# Patient Record
Sex: Female | Born: 1950
Health system: Southern US, Community
[De-identification: ages and names within clinical notes are randomized; demographics above are authoritative.]

## PROBLEM LIST (undated history)

## (undated) DIAGNOSIS — E785 Hyperlipidemia, unspecified: Secondary | ICD-10-CM

## (undated) DIAGNOSIS — F32A Depression, unspecified: Secondary | ICD-10-CM

## (undated) DIAGNOSIS — M1990 Unspecified osteoarthritis, unspecified site: Secondary | ICD-10-CM

## (undated) DIAGNOSIS — F419 Anxiety disorder, unspecified: Secondary | ICD-10-CM

## (undated) DIAGNOSIS — F329 Major depressive disorder, single episode, unspecified: Secondary | ICD-10-CM

## (undated) DIAGNOSIS — Z86010 Personal history of colonic polyps: Secondary | ICD-10-CM

## (undated) DIAGNOSIS — K52832 Lymphocytic colitis: Principal | ICD-10-CM

## (undated) DIAGNOSIS — C50919 Malignant neoplasm of unspecified site of unspecified female breast: Secondary | ICD-10-CM

## (undated) HISTORY — PX: COLONOSCOPY: SHX174

## (undated) HISTORY — DX: Anxiety disorder, unspecified: F41.9

## (undated) HISTORY — DX: Hyperlipidemia, unspecified: E78.5

## (undated) HISTORY — PX: TONSILLECTOMY: SUR1361

## (undated) HISTORY — DX: Major depressive disorder, single episode, unspecified: F32.9

## (undated) HISTORY — DX: Lymphocytic colitis: K52.832

## (undated) HISTORY — DX: Personal history of colonic polyps: Z86.010

## (undated) HISTORY — DX: Depression, unspecified: F32.A

## (undated) HISTORY — PX: POLYPECTOMY: SHX149

## (undated) HISTORY — DX: Malignant neoplasm of unspecified site of unspecified female breast: C50.919

## (undated) HISTORY — DX: Unspecified osteoarthritis, unspecified site: M19.90

---

## 2000-06-20 DIAGNOSIS — C50919 Malignant neoplasm of unspecified site of unspecified female breast: Secondary | ICD-10-CM

## 2000-06-20 HISTORY — DX: Malignant neoplasm of unspecified site of unspecified female breast: C50.919

## 2000-06-20 HISTORY — PX: MASTECTOMY: SHX3

## 2000-06-20 HISTORY — PX: RECONSTRUCTION BREAST W/ TRAM FLAP: SUR1079

## 2003-06-21 DIAGNOSIS — Z86010 Personal history of colonic polyps: Secondary | ICD-10-CM

## 2003-06-21 DIAGNOSIS — Z860101 Personal history of adenomatous and serrated colon polyps: Secondary | ICD-10-CM

## 2003-06-21 HISTORY — DX: Personal history of colonic polyps: Z86.010

## 2003-06-21 HISTORY — DX: Personal history of adenomatous and serrated colon polyps: Z86.0101

## 2003-07-18 DIAGNOSIS — Z860100 Personal history of colon polyps, unspecified: Secondary | ICD-10-CM | POA: Insufficient documentation

## 2010-07-07 ENCOUNTER — Ambulatory Visit: Admission: RE | Admit: 2010-07-07 | Payer: Self-pay | Source: Home / Self Care | Admitting: Vascular Surgery

## 2010-07-30 ENCOUNTER — Inpatient Hospital Stay (INDEPENDENT_AMBULATORY_CARE_PROVIDER_SITE_OTHER)
Admission: RE | Admit: 2010-07-30 | Discharge: 2010-07-30 | Disposition: A | Discharge status: Home / Self Care | Payer: Commercial Managed Care - PPO | Source: Ambulatory Visit | Attending: Emergency Medicine | Admitting: Emergency Medicine

## 2010-07-30 DIAGNOSIS — L989 Disorder of the skin and subcutaneous tissue, unspecified: Secondary | ICD-10-CM

## 2010-07-30 LAB — COMPREHENSIVE METABOLIC PANEL
ALT: 27 U/L (ref 0–35)
AST: 32 U/L (ref 0–37)
Albumin: 4.5 g/dL (ref 3.5–5.2)
Calcium: 9.8 mg/dL (ref 8.4–10.5)
Creatinine, Ser: 0.51 mg/dL (ref 0.4–1.2)
GFR calc Af Amer: >60 mL/min (ref >60)
Sodium: 138 mEq/L (ref 135–145)
Total Protein: 6.9 g/dL (ref 6.0–8.3)

## 2010-07-30 LAB — DIFFERENTIAL
Basophils Absolute: 0 10*3/uL (ref 0.0–0.1)
Basophils Relative: 0 % (ref 0–1)
Eosinophils Absolute: 0.2 10*3/uL (ref 0.0–0.7)
Eosinophils Relative: 2 % (ref 0–5)
Monocytes Absolute: 0.6 10*3/uL (ref 0.1–1.0)
Neutro Abs: 5.1 10*3/uL (ref 1.7–7.7)

## 2010-07-30 LAB — CBC
Hemoglobin: 13 g/dL (ref 12.0–15.0)
MCHC: 34.4 g/dL (ref 30.0–36.0)
Platelets: 258 10*3/uL (ref 150–400)
RDW: 13.5 % (ref 11.5–15.5)

## 2010-08-02 LAB — ANA: Anti Nuclear Antibody(ANA): NEGATIVE

## 2010-11-02 ENCOUNTER — Encounter: Payer: Self-pay | Admitting: Oncology

## 2010-11-25 ENCOUNTER — Encounter (HOSPITAL_BASED_OUTPATIENT_CLINIC_OR_DEPARTMENT_OTHER): Payer: 59 | Admitting: Oncology

## 2010-11-25 ENCOUNTER — Other Ambulatory Visit (HOSPITAL_COMMUNITY): Payer: Self-pay | Admitting: Oncology

## 2010-11-25 ENCOUNTER — Other Ambulatory Visit: Payer: Self-pay | Admitting: Physician Assistant

## 2010-11-25 DIAGNOSIS — C50919 Malignant neoplasm of unspecified site of unspecified female breast: Secondary | ICD-10-CM

## 2010-11-25 LAB — COMPREHENSIVE METABOLIC PANEL WITH GFR
ALT: 23 U/L (ref 0–35)
AST: 25 U/L (ref 0–37)
Albumin: 4.1 g/dL (ref 3.5–5.2)
Alkaline Phosphatase: 87 U/L (ref 39–117)
BUN: 19 mg/dL (ref 6–23)
CO2: 27 meq/L (ref 19–32)
Calcium: 9.8 mg/dL (ref 8.4–10.5)
Chloride: 98 meq/L (ref 96–112)
Creatinine, Ser: 0.59 mg/dL (ref 0.50–1.10)
Glucose, Bld: 99 mg/dL (ref 70–99)
Potassium: 3.8 meq/L (ref 3.5–5.3)
Sodium: 135 meq/L (ref 135–145)
Total Bilirubin: 0.4 mg/dL (ref 0.3–1.2)
Total Protein: 6.4 g/dL (ref 6.0–8.3)

## 2010-11-25 LAB — CBC WITH DIFFERENTIAL/PLATELET
BASO%: 0.4 % (ref 0.0–2.0)
Basophils Absolute: 0 10e3/uL (ref 0.0–0.1)
EOS%: 1.6 % (ref 0.0–7.0)
Eosinophils Absolute: 0.2 10e3/uL (ref 0.0–0.5)
HCT: 36.8 % (ref 34.8–46.6)
HGB: 12.7 g/dL (ref 11.6–15.9)
LYMPH%: 31.6 % (ref 14.0–49.7)
MCH: 31.5 pg (ref 25.1–34.0)
MCHC: 34.5 g/dL (ref 31.5–36.0)
MCV: 91.3 fL (ref 79.5–101.0)
MONO#: 0.8 10e3/uL (ref 0.1–0.9)
MONO%: 7.9 % (ref 0.0–14.0)
NEUT#: 5.6 10e3/uL (ref 1.5–6.5)
NEUT%: 58.5 % (ref 38.4–76.8)
Platelets: 232 10e3/uL (ref 145–400)
RBC: 4.03 10e6/uL (ref 3.70–5.45)
RDW: 13.4 % (ref 11.2–14.5)
WBC: 9.5 10e3/uL (ref 3.9–10.3)
lymph#: 3 10e3/uL (ref 0.9–3.3)

## 2010-11-25 LAB — LACTATE DEHYDROGENASE: LDH: 190 U/L (ref 94–250)

## 2010-11-26 LAB — VITAMIN D 25 HYDROXY (VIT D DEFICIENCY, FRACTURES): Vit D, 25-Hydroxy: 48 ng/mL (ref 30–89)

## 2011-05-25 ENCOUNTER — Encounter: Payer: Self-pay | Admitting: Internal Medicine

## 2011-06-01 ENCOUNTER — Telehealth: Payer: Self-pay | Admitting: Oncology

## 2011-06-01 NOTE — Telephone Encounter (Signed)
Lvm advising appt 07/25/11 @ 2.30pm r/s'd from 12/6 appt cx'd due to Epic.

## 2011-07-04 ENCOUNTER — Ambulatory Visit (AMBULATORY_SURGERY_CENTER): Payer: 59 | Admitting: *Deleted

## 2011-07-04 VITALS — Ht 64.0 in | Wt 141.3 lb

## 2011-07-04 DIAGNOSIS — Z1211 Encounter for screening for malignant neoplasm of colon: Secondary | ICD-10-CM

## 2011-07-04 MED ORDER — PEG-KCL-NACL-NASULF-NA ASC-C 100 G PO SOLR: ORAL | Status: DC

## 2011-07-04 NOTE — Progress Notes (Signed)
Pt takes Seroquel, Celexa, and Xanax.  Rescheduled colonoscopy with Propofol.  Pt had colonoscopy for colon polyps 3 years ago in [place], [place] with Dr. Payton Mccallum.  Release of information form signed and will give to Aurelio Brash, CMA.  Colonoscopy scheduled for Monday 2/4 at 2:00 pm Ezra Sites

## 2011-07-06 ENCOUNTER — Telehealth: Payer: Self-pay | Admitting: *Deleted

## 2011-07-06 NOTE — Telephone Encounter (Signed)
Pt had colonoscopy for colon polyps 3 years ago in [place], [place] with Dr. Payton Mccallum. Release of information form signed and will give to Aurelio Brash, CMA. Colonoscopy scheduled for Monday 2/4 at 2:00 pm Ezra Sites

## 2011-07-06 NOTE — Telephone Encounter (Signed)
Medical records release faxed 

## 2011-07-18 ENCOUNTER — Encounter: Payer: Self-pay | Admitting: Internal Medicine

## 2011-07-18 ENCOUNTER — Telehealth: Payer: Self-pay

## 2011-07-18 ENCOUNTER — Other Ambulatory Visit: Payer: 59 | Admitting: Internal Medicine

## 2011-07-18 NOTE — Telephone Encounter (Signed)
Left message for patient to call back  

## 2011-07-18 NOTE — Telephone Encounter (Signed)
Message copied by Annett Fabian on Mon Jul 18, 2011  2:57 PM ------      Message from: Stan Head E      Created: Mon Jul 18, 2011  2:04 PM      Regarding: results of records review       Please let her know I reviewed outside records      Adenomas 2005 and none in 2010      Since no polyps in 2010 then next routine colonoscopy would be 2015 based upon my understanding of current guidelines             Leas let her know this - not "wrong" to do it now but it is early and I think appropriate to wait until 06/2013 unless there are other issues                  Her choice - let me know if ?'s

## 2011-07-18 NOTE — Telephone Encounter (Signed)
Records received

## 2011-07-19 NOTE — Telephone Encounter (Signed)
Patient advised.  New recall placed for 06/2013

## 2011-07-22 ENCOUNTER — Telehealth: Payer: Self-pay | Admitting: Oncology

## 2011-07-22 NOTE — Telephone Encounter (Signed)
spoke with pt and she is aware of 2/11 appt per dr ll  aom °

## 2011-07-25 ENCOUNTER — Other Ambulatory Visit: Payer: 59 | Admitting: Lab

## 2011-07-25 ENCOUNTER — Encounter: Payer: 59 | Admitting: Internal Medicine

## 2011-07-25 ENCOUNTER — Ambulatory Visit: Payer: 59 | Admitting: Oncology

## 2011-08-15 ENCOUNTER — Telehealth: Payer: Self-pay | Admitting: Oncology

## 2011-08-15 NOTE — Telephone Encounter (Signed)
pt called to verify appt info 3/25    aom

## 2011-08-17 ENCOUNTER — Telehealth: Payer: Self-pay | Admitting: Oncology

## 2011-08-17 NOTE — Telephone Encounter (Signed)
lm that 3/25 appt had been cx and r/s to 4/15   aom

## 2011-09-01 ENCOUNTER — Encounter: Payer: 59 | Admitting: Internal Medicine

## 2011-09-12 ENCOUNTER — Ambulatory Visit: Payer: 59 | Admitting: Oncology

## 2011-09-12 ENCOUNTER — Other Ambulatory Visit: Payer: 59 | Admitting: Lab

## 2011-09-15 ENCOUNTER — Telehealth: Payer: Self-pay | Admitting: Oncology

## 2011-09-15 NOTE — Telephone Encounter (Signed)
per kb move appt to 4/18 with sara,l/m with appt info for pt aom

## 2011-09-22 ENCOUNTER — Telehealth: Payer: Self-pay | Admitting: Oncology

## 2011-09-22 NOTE — Telephone Encounter (Signed)
pt l/m to r/s appt new appt is 4/29 per pt aom

## 2011-09-30 ENCOUNTER — Telehealth: Payer: Self-pay | Admitting: Oncology

## 2011-09-30 NOTE — Telephone Encounter (Signed)
Time of 4/29 appt change to 9 am due to schedule change for SW. lmonvm for pt re new time. Schedule mailed.

## 2011-10-03 ENCOUNTER — Other Ambulatory Visit: Payer: 59 | Admitting: Lab

## 2011-10-03 ENCOUNTER — Ambulatory Visit: Payer: 59 | Admitting: Oncology

## 2011-10-03 ENCOUNTER — Telehealth: Payer: Self-pay | Admitting: Oncology

## 2011-10-03 NOTE — Telephone Encounter (Signed)
Pt called to r/s her 4/29 appt to 5/8    aom

## 2011-10-06 ENCOUNTER — Telehealth: Payer: Self-pay | Admitting: Oncology

## 2011-10-06 ENCOUNTER — Other Ambulatory Visit: Payer: 59 | Admitting: Lab

## 2011-10-06 ENCOUNTER — Ambulatory Visit: Payer: 59 | Admitting: Physician Assistant

## 2011-10-06 NOTE — Telephone Encounter (Signed)
PT CALLED TO R/S 5/8 TO 5/10  AOM

## 2011-10-17 ENCOUNTER — Ambulatory Visit: Payer: 59 | Admitting: Physician Assistant

## 2011-10-17 ENCOUNTER — Other Ambulatory Visit: Payer: 59 | Admitting: Lab

## 2011-10-26 ENCOUNTER — Other Ambulatory Visit: Payer: 59 | Admitting: Lab

## 2011-10-26 ENCOUNTER — Ambulatory Visit: Payer: 59 | Admitting: Physician Assistant

## 2011-10-28 ENCOUNTER — Other Ambulatory Visit (HOSPITAL_BASED_OUTPATIENT_CLINIC_OR_DEPARTMENT_OTHER): Payer: 59 | Admitting: Lab

## 2011-10-28 ENCOUNTER — Other Ambulatory Visit: Payer: Self-pay | Admitting: Oncology

## 2011-10-28 ENCOUNTER — Ambulatory Visit (HOSPITAL_BASED_OUTPATIENT_CLINIC_OR_DEPARTMENT_OTHER): Payer: 59 | Admitting: Physician Assistant

## 2011-10-28 ENCOUNTER — Encounter: Payer: Self-pay | Admitting: Physician Assistant

## 2011-10-28 ENCOUNTER — Telehealth: Payer: Self-pay | Admitting: Oncology

## 2011-10-28 VITALS — BP 121/63 | HR 57 | Temp 96.9°F | Ht 64.0 in | Wt 144.5 lb

## 2011-10-28 DIAGNOSIS — C50919 Malignant neoplasm of unspecified site of unspecified female breast: Secondary | ICD-10-CM

## 2011-10-28 DIAGNOSIS — Z853 Personal history of malignant neoplasm of breast: Secondary | ICD-10-CM

## 2011-10-28 LAB — COMPREHENSIVE METABOLIC PANEL
ALT: 16 U/L (ref 0–35)
AST: 23 U/L (ref 0–37)
Albumin: 4.5 g/dL (ref 3.5–5.2)
Alkaline Phosphatase: 61 U/L (ref 39–117)
Glucose, Bld: 99 mg/dL (ref 70–99)
Potassium: 4.4 mEq/L (ref 3.5–5.3)
Sodium: 138 mEq/L (ref 135–145)
Total Bilirubin: 0.4 mg/dL (ref 0.3–1.2)
Total Protein: 6.4 g/dL (ref 6.0–8.3)

## 2011-10-28 LAB — CBC WITH DIFFERENTIAL/PLATELET
BASO%: 1.2 % (ref 0.0–2.0)
Basophils Absolute: 0.1 10*3/uL (ref 0.0–0.1)
EOS%: 6 % (ref 0.0–7.0)
HGB: 12.6 g/dL (ref 11.6–15.9)
MCH: 31.5 pg (ref 25.1–34.0)
MCHC: 33.8 g/dL (ref 31.5–36.0)
MCV: 93.2 fL (ref 79.5–101.0)
MONO%: 9.8 % (ref 0.0–14.0)
RBC: 4.01 10*6/uL (ref 3.70–5.45)
RDW: 12.9 % (ref 11.2–14.5)
lymph#: 2.2 10*3/uL (ref 0.9–3.3)
nRBC: 0 % (ref 0–0)

## 2011-10-28 NOTE — Telephone Encounter (Signed)
appts made and printed for pt aom °

## 2011-10-28 NOTE — Progress Notes (Signed)
[facility] OFFICE PROGRESS NOTE  Donna Kayser, MD, MD  PATIENT IDENTIFICATION: 61 year old woman originally from [place], [place], with a diagnosis of breast cancer,  0.8 cm ductal carcinoma with extensive DCIS apparently detected in December 2001.  She underwent an axillary lymph node dissection in January 2002 and then underwent a left modified radical mastectomy with TRAM reconstruction in February 2002.  There was micrometastatic involvement of 1/10 lymph nodes. Therefore, this was at T1 N1 cancer, apparently and initially  stage IIA .In looking at the cancer staging handbook from AJCC, patient's with T2 and T1 tumors with nodal micrometastases only are excluded from stage IIA and are classified a stage IB.  Therefore, this patient, who had a fairly small primary tumor, T1b, and apparently micrometastatic focus in one node, may technically be classified as a T1b, according to the seventh edition of the cancer staging handbook. She is Estrogen and progesterone receptors were positive.  HER-2/neu was negative.  The patient underwent chemotherapy with Adriamycin and Cytoxan for four cycles and then Taxotere for four cycles.  She received tamoxifen for 2 years and then when she became postmenopausal, she was placed on Arimidex. This was discontinued as of 10 years later, during her first visit to [place] in 11/25/2010. The patient's last mammogram, according to the records we received, was June 01/2012 at [facility]. Records are not available at this time. She is due for another mammogram in June of this year. She had a bone density scan on July 25, 2008.  T-score of the lumbar spine was between 0 and -1, normal.  The lowest T-score was -1.3 and that would involve the greater trochanter.     INTERVAL HISTORY: Donna Hamilton returns today for a followup appointment, with no clinical complaints. She remains active, able to complete activities of daily been without complications. She works at the [employer] full time. It has been one year since she moved from [place], stating that she enjoys the [place] quite a bit. She denies any fever, chills ,night sweats,nausea, vomiting ,headaches or shortness of breath. Her last physical exam was performed in July of last year. In March of 2013, labs revealed elevated triglycerides to 1335, for which her primary care physician, Dr. Waynard Edwards, placed to her on Lovaza 1 g bid, with significant reduction of these triglycerides (patient does not recall the exact value but states  has responded well to the drug. No appetite changes.Her weight has remained stable. On her left breast, she denies any new masses, nipple discharge or inversion. She performs manual on a monthly basis. The right breast remains without any new masses according to her. Today, her CBC shows a white count of 6.1 with a neutrophile count of 2.8,  hemoglobin of 12.6 hematocrit 37.3 MCV 93.2 and platelet count of 264. On 11/25/2010, her white count was 9.5, ANC 5.6, hemoglobin 12.7, hematocrit 36.8, platelets 232,000.  Differential is normal as are red cell indices. Chemistries were normal, with new chemistries pending. The last LDH in 11/25/2010 was 190, with new LDH pending. Her last vitamin D on the same day was 48, with new value is pending. She is due for a new chest x-ray during her upcoming  physical according to her.   MEDICAL HISTORY: Past Medical History  Diagnosis Date  . Arthritis   . Anxiety   . Depression   . Breast cancer 2002    left breast  . Hx of adenomatous colonic polyps 2005  [place]    SURGICAL HISTORY:  Past Surgical History  Procedure Date  . Mastectomy 2002    left  . Tonsillectomy     MEDICATIONS: Current Outpatient Prescriptions  Medication Sig Dispense Refill  . ALPRAZolam (XANAX) 0.25 MG tablet Take 0.25 mg by mouth at bedtime as needed.      . Calcium Carbonate-Vitamin D (CALTRATE 600+D PO) Take by mouth. Takes 2 tablets daily      . Cholecalciferol  (VITAMIN D3) 2000 UNITS TABS Take by mouth daily.      . citalopram (CELEXA) 20 MG tablet Take 20 mg by mouth daily. Takes 3 tablets daily      . fish oil-omega-3 fatty acids 1000 MG capsule Take by mouth daily. Takes 2 capsules daily      . Glucosamine HCl-Glucosamin SO4 (GLUCOSAMINE COMPLEX PO) Take by mouth. Takes 2 tablets daily      . Multiple Vitamin (MULTIVITAMIN) tablet Take 1 tablet by mouth daily.      . niacinamide 500 MG tablet Take 500 mg by mouth. Takes 2 tablets daily      . omega-3 acid ethyl esters (LOVAZA) 1 G capsule Take 1 g by mouth 2 (two) times daily.      . peg 3350 powder (MOVIPREP) 100 G SOLR moviprep as directed  1 kit  0  . QUEtiapine (SEROQUEL) 25 MG tablet Take 25 mg by mouth at bedtime.        ALLERGIES:   has no known allergies.  REVIEW OF SYSTEMS:  The rest of the 14-point review of system was negative.   Filed Vitals:   10/28/11 0929  BP: 121/63  Pulse: 57  Temp: 96.9 F (36.1 C)   Wt Readings from Last 3 Encounters:  10/28/11 144 lb 8 oz (65.545 kg)  07/04/11 141 lb 4.8 oz (64.093 kg)     PHYSICAL EXAMINATION:. 61 year old white female in no acute distress, who looks at her stated age.  No scalp or skull lesions.  Pupillary and extraocular movements are normal.  No scleral icterus.  Mouth and pharynx benign.  Dentition is well maintained.  Neck is without adenopathy, thyroid enlargement or bruit.  Heart/Lungs:  Normal.  Back:  No skeletal tenderness or deformity.  The left breast has been reconstructed with a TRAM procedure.  She has also had reconstruction of the nipple-areolar complex.  Incisions are well-healed.  There is good symmetry between the breasts and the cosmetic result is favorable.  Both breasts are generally soft, unremarkable, with no suspicious findings, dimpling or nipple inversion.  No axillary or inguinal adenopathy.  Abdomen:  Benign with no organomegaly or masses palpable.  Surgical scars have healed well.  Extremities:  No  peripheral edema, clubbing, erythema, petechiae or purpura.  She does have a well-healed scar from a Port-A-Cath that was present, subsequently removed in the right chest.  Neurologic:  Grossly normal.    LABORATORY/RADIOLOGY DATA:   Lab 10/28/11 0905  WBC 6.1  HGB 12.6  HCT 37.3  PLT 264  MCV 93.2  MCH 31.5  MCHC 33.8  RDW 12.9  LYMPHSABS 2.2  MONOABS 0.6  EOSABS 0.4  BASOSABS 0.1  BANDABS --    CMP   No results found for this basename: NA:5,K:5,CL:5,CO2:5,GLUCOSE:5,BUN:5,CREATININE:5,GFRCGP,:5,CALCIUM:5,MG:5,AST:5,ALT:5,ALKPHOS:5,BILITOT:5 in the last 168 hours      Component Value Date/Time   BILITOT 0.4 11/25/2010 1448     Radiology Studies:  No results found.     ASSESSMENT AND PLAN:  61 year old  woman with  Stage  T1b, ductal carcinoma with extensive DCIS, s/p axillary lymph node dissection in January 2002 and left modified radical mastectomy with TRAM reconstruction in February 2002.  There was micrometastatic involvement of 1/10 lymph nodes. Estrogen and progesterone receptors were positive.  HER-2/neu was negative.  The patient underwent chemotherapy with Adriamycin and Cytoxan for four cycles and then Taxotere for four cycles.  She received tamoxifen for 2 years and then when she became postmenopausal, she was placed on Arimidex. This was discontinued on 11/26/2010. She has remained on observation only.  Kaelynn is clinically doing well, now close to 11 1/2 years from the time of her diagnosis in December of 2001. They are no signs or symptoms to suggest recurrence of the disease. Dr. Arline Asp has seen and evaluated the patient, concluding that she can be followup in one year basis. She agreed with the plan. She is to return in May of 2014, with labs. In the interim she is to undergo a new mammogram, which records are going to be sent to [place] for evaluation. She knows to call us if she has any questions or concerns.

## 2011-11-10 ENCOUNTER — Other Ambulatory Visit: Payer: Self-pay | Admitting: Obstetrics & Gynecology

## 2011-11-10 DIAGNOSIS — Z1231 Encounter for screening mammogram for malignant neoplasm of breast: Secondary | ICD-10-CM

## 2011-11-21 ENCOUNTER — Ambulatory Visit: Payer: 59

## 2011-12-01 ENCOUNTER — Ambulatory Visit: Payer: 59

## 2011-12-12 ENCOUNTER — Ambulatory Visit: Payer: 59

## 2011-12-26 ENCOUNTER — Ambulatory Visit
Admission: RE | Admit: 2011-12-26 | Discharge: 2011-12-26 | Disposition: A | Discharge status: Home / Self Care | Payer: 59 | Source: Ambulatory Visit | Attending: Obstetrics & Gynecology | Admitting: Obstetrics & Gynecology

## 2011-12-26 DIAGNOSIS — Z1231 Encounter for screening mammogram for malignant neoplasm of breast: Secondary | ICD-10-CM

## 2012-02-10 ENCOUNTER — Encounter: Payer: Self-pay | Admitting: Vascular Surgery

## 2012-02-13 ENCOUNTER — Ambulatory Visit (INDEPENDENT_AMBULATORY_CARE_PROVIDER_SITE_OTHER): Payer: 59 | Admitting: Vascular Surgery

## 2012-02-13 ENCOUNTER — Ambulatory Visit (INDEPENDENT_AMBULATORY_CARE_PROVIDER_SITE_OTHER): Payer: 59 | Admitting: *Deleted

## 2012-02-13 ENCOUNTER — Encounter: Payer: 59 | Admitting: Vascular Surgery

## 2012-02-13 ENCOUNTER — Encounter: Payer: Self-pay | Admitting: Vascular Surgery

## 2012-02-13 VITALS — BP 135/75 | HR 69 | Resp 16 | Ht 64.0 in | Wt 146.0 lb

## 2012-02-13 DIAGNOSIS — I83893 Varicose veins of bilateral lower extremities with other complications: Secondary | ICD-10-CM

## 2012-02-13 NOTE — Progress Notes (Signed)
Subjective:     Patient ID: Donna Hamilton, female   DOB: 01-Dec-1950, 61 y.o.   MRN: [id number]  HPI this 61 year old female has a long history of bilateral venous insufficiency right greater than left. She has been having aching burning and throbbing discomfort which worsens as the day progresses. She has tried elastic compression stockings in the past with no success. She was evaluated for her venous disease in [place] prior to moving to [place] and laser ablation was recommended but never carried out. She has continued to have worsening of her symptoms with edema in the ankles as the day progresses. She has no history of DVT, thrombophlebitis, stasis ulcers, or bleeding.  Past Medical History  Diagnosis Date  . Arthritis   . Anxiety   . Depression   . Breast cancer 2002    left breast  . Hx of adenomatous colonic polyps 2005    [place]    History  Substance Use Topics  . Smoking status: Former Smoker -- 1.0 packs/day for 30 years    Types: Cigarettes    Quit date: 10/28/2010  . Smokeless tobacco: Never Used  . Alcohol Use: 4.2 oz/week    7 Glasses of wine per week    Family History  Problem Relation Age of Onset  . Colon cancer Neg Hx   . Stomach cancer Neg Hx   . Other Father     Varicose Veins  . Heart disease Mother   . Hypertension Mother   . Other Mother     Varicose Veins  . Hypertension Brother     No Known Allergies  Current outpatient prescriptions:ALPRAZolam (XANAX) 0.25 MG tablet, Take 0.25 mg by mouth. Takes 1/2 of tablet prn for sleep, Disp: , Rfl: ;  atorvastatin (LIPITOR) 20 MG tablet, Take 20 mg by mouth daily., Disp: , Rfl: ;  Calcium Carbonate-Vitamin D (CALTRATE 600+D PO), Take by mouth. Takes 1 tablets daily, Disp: , Rfl: ;  Cholecalciferol (VITAMIN D3) 2000 UNITS TABS, Take by mouth. Take 2 daily, Disp: , Rfl:  citalopram (CELEXA) 20 MG tablet, Take 20 mg by mouth daily. Takes 3 tablets daily, Disp: , Rfl: ;  fenofibrate 160 MG tablet, Take 160 mg  by mouth daily., Disp: , Rfl: ;  Glucosamine HCl-Glucosamin SO4 (GLUCOSAMINE COMPLEX PO), Take by mouth. Takes 2 tablets daily, Disp: , Rfl: ;  niacinamide 500 MG tablet, Take 500 mg by mouth. Takes 2 tablets daily, Disp: , Rfl:  omega-3 acid ethyl esters (LOVAZA) 1 G capsule, Take 1 g by mouth 2 (two) times daily., Disp: , Rfl: ;  QUEtiapine (SEROQUEL) 25 MG tablet, Take 25 mg by mouth at bedtime., Disp: , Rfl:   BP 135/75  Pulse 69  Resp 16  Ht 5\' 4"  (1.626 m)  Wt 146 lb (66.225 kg)  BMI 25.06 kg/m2  Body mass index is 25.06 kg/(m^2).          Review of Systems denies chest pain, dyspnea on exertion, PND, orthopnea, chronic bronchitis, claudication, hemoptysis. All systems negative and complete review of systems    Objective:   Physical Exam blood pressure 135/75 heart rate 69 respirations 16 Gen.-alert and oriented x3 in no apparent distress HEENT normal for age Lungs no rhonchi or wheezing Cardiovascular regular rhythm no murmurs carotid pulses 3+ palpable no bruits audible Abdomen soft nontender no palpable masses Musculoskeletal free of  major deformities Skin clear -no rashes Neurologic normal Lower extremities 3+ femoral and dorsalis pedis pulses palpable bilaterally  with 1+ edema bilaterally. Right leg with bulging varicosities beginning in the distal thigh extending into the medial calf down to the ankle with severe reticular and spider veins along the posterior aspect of the medial malleolus. 1+ edema and early hyperpigmentation is present. Left leg with bulging varicosities in the posterior calf extending up to the popliteal fossa with 1+ distal edema no active ulceration.  Today I ordered bilateral venous duplex exam of the lower extremities which are reviewed and interpreted. The right leg has gross reflux throughout the great saphenous system supplying these medial bulging varicosities and distal reticular and spider veins. Left leg has reflux in the great saphenous  vein but the vein is small in caliber. A small saphenous vein has reflux but there is a segment of previous thrombophlebitis where the vein is occluded near the popliteal fossa.      Assessment:     Severe bilateral venous insufficiency with painful varicosities-affecting patient's daily living-due to reflux in right great saphenous and left small saphenous systems    Plan:     #1 long-leg elastic compression stockings 20-30 mm gradient #2 elevate legs as much as possible not feasible during the day #3 ibuprofen on a daily basis #4 return in 3 months. If no improvement she will need 1 laser ablation right great saphenous vein with 10-20 stab phlebectomy followed by one course of sclerotherapy and ankle area Following this patient will need 10-20 stab phlebectomy of left popliteal fossa bulging varicosities since laser ablation will not be feasible because of segment of previous thrombophlebitis Patient returned 3 and a

## 2012-02-28 ENCOUNTER — Ambulatory Visit (INDEPENDENT_AMBULATORY_CARE_PROVIDER_SITE_OTHER): Payer: Self-pay | Admitting: Family Medicine

## 2012-02-28 DIAGNOSIS — Z713 Dietary counseling and surveillance: Secondary | ICD-10-CM

## 2012-03-22 ENCOUNTER — Encounter: Payer: Self-pay | Admitting: Vascular Surgery

## 2012-05-07 ENCOUNTER — Encounter: Payer: Self-pay | Admitting: Vascular Surgery

## 2012-05-08 ENCOUNTER — Encounter: Payer: Self-pay | Admitting: Vascular Surgery

## 2012-05-08 ENCOUNTER — Ambulatory Visit (INDEPENDENT_AMBULATORY_CARE_PROVIDER_SITE_OTHER): Payer: 59 | Admitting: Vascular Surgery

## 2012-05-08 VITALS — BP 147/75 | HR 80 | Resp 18 | Ht 64.0 in | Wt 147.0 lb

## 2012-05-08 DIAGNOSIS — I83893 Varicose veins of bilateral lower extremities with other complications: Secondary | ICD-10-CM

## 2012-05-08 NOTE — Progress Notes (Signed)
Subjective:     Patient ID: Donna Hamilton, female   DOB: March 05, 1951, 61 y.o.   MRN: [id number]  HPI this 61 year old female returns for continued followup regarding her severe bilateral venous insufficiency. She has been having aching throbbing and burning discomfort in both lower extremities from bulging varicosities. She's also had some significant skin changes with a constant rash in the right ankle near the medial malleolus which has not responded to dermatologic ointments. She has no history of DVT. Her symptoms have not improved with long-leg elastic compression stockings 20-30 mm gradient as well as elevation and ibuprofen. These are affecting her daily living and ability to work. She has no history of stasis ulcers or bleeding.  Past Medical History  Diagnosis Date  . Arthritis   . Anxiety   . Depression   . Breast cancer 2002    left breast  . Hx of adenomatous colonic polyps 2005    [place]    History  Substance Use Topics  . Smoking status: Former Smoker -- 1.0 packs/day for 30 years    Types: Cigarettes    Quit date: 10/28/2010  . Smokeless tobacco: Never Used  . Alcohol Use: 4.2 oz/week    7 Glasses of wine per week    Family History  Problem Relation Age of Onset  . Colon cancer Neg Hx   . Stomach cancer Neg Hx   . Other Father     Varicose Veins  . Heart disease Mother   . Hypertension Mother   . Other Mother     Varicose Veins  . Hypertension Brother     No Known Allergies  Current outpatient prescriptions:ALPRAZolam (XANAX) 0.25 MG tablet, Take 0.25 mg by mouth. Takes 1/2 of tablet prn for sleep, Disp: , Rfl: ;  atorvastatin (LIPITOR) 20 MG tablet, Take 20 mg by mouth daily., Disp: , Rfl: ;  Calcium Carbonate-Vitamin D (CALTRATE 600+D PO), Take by mouth. Takes 1 tablets daily, Disp: , Rfl: ;  Cholecalciferol (VITAMIN D3) 2000 UNITS TABS, Take by mouth. Take 2 daily, Disp: , Rfl:  citalopram (CELEXA) 20 MG tablet, Take 20 mg by mouth daily. Takes 3 tablets  daily, Disp: , Rfl: ;  fenofibrate 160 MG tablet, Take 160 mg by mouth daily., Disp: , Rfl: ;  Glucosamine HCl-Glucosamin SO4 (GLUCOSAMINE COMPLEX PO), Take by mouth. Takes 2 tablets daily, Disp: , Rfl: ;  niacinamide 500 MG tablet, Take 500 mg by mouth. Takes 2 tablets daily, Disp: , Rfl:  omega-3 acid ethyl esters (LOVAZA) 1 G capsule, Take 1 g by mouth 2 (two) times daily., Disp: , Rfl: ;  QUEtiapine (SEROQUEL) 25 MG tablet, Take 25 mg by mouth at bedtime., Disp: , Rfl:   BP 147/75  Pulse 80  Resp 18  Ht 5\' 4"  (1.626 m)  Wt 147 lb (66.679 kg)  BMI 25.23 kg/m2  Body mass index is 25.23 kg/(m^2).        Review of Systems denies chest pain, dyspnea on exertion, PND, orthopnea, hemoptysis, claudication     Objective:   Physical Exam blood pressure 147/75 heart rate 80 respirations 18 General well-developed well-nourished female in no apparent stress alert and oriented x3 Lungs no rhonchi or wheezing Lower extremities right leg with bulging varicosities in the medial distal thigh and medial calf anteriorly and posteriorly with reticular and spider veins and the medial malleolar area. There are 2 for radicular rashes about 2 cm in diameter near the medial malleolus on the right. There's slight  hyperpigmentation. Left leg has bulging varicosities in the posterior calf extending up the popliteal fossa with 1+ edema.  Patient does have gross reflux in the right great saphenous as well as the left small saphenous supplying bulging varicosities in both lower extremities accounting for her symptomatology.  I performed a bedside venous duplex exam with the SonoSite ultrasound today to look at the left small saphenous vein. It is a large vein which is supplying these bulging varicosities and there is a significant segment to close with laser ablation to treat this area.    Assessment:     #1 severe venous insufficiency both legs right great saphenous vein with gross reflux in left small  saphenous vein with gross reflux supplying these bulging varicosities-resistant to conservative measures    Plan:     Patient needs #1 laser ablation right great saphenous vein with 10-20 stab phlebectomy followed by one course of sclerotherapy #2 left small saphenous laser ablation with 10-20 stab phlebectomy as second procedure Will proceed with precertification to perform this in the near future

## 2012-05-28 ENCOUNTER — Other Ambulatory Visit: Payer: Self-pay | Admitting: *Deleted

## 2012-05-28 DIAGNOSIS — I83893 Varicose veins of bilateral lower extremities with other complications: Secondary | ICD-10-CM

## 2012-07-06 ENCOUNTER — Encounter: Payer: Self-pay | Admitting: Vascular Surgery

## 2012-07-09 ENCOUNTER — Ambulatory Visit (INDEPENDENT_AMBULATORY_CARE_PROVIDER_SITE_OTHER): Payer: 59 | Admitting: Vascular Surgery

## 2012-07-09 ENCOUNTER — Encounter: Payer: Self-pay | Admitting: Vascular Surgery

## 2012-07-09 VITALS — BP 171/80 | HR 80 | Resp 16 | Ht 64.0 in | Wt 147.0 lb

## 2012-07-09 DIAGNOSIS — I83893 Varicose veins of bilateral lower extremities with other complications: Secondary | ICD-10-CM

## 2012-07-09 NOTE — Progress Notes (Signed)
Laser Ablation Procedure      Date: 07/09/2012    Donna Hamilton DOB:Aug 22, 1950  Consent signed: Yes  Surgeon:J.D. Hart [place]  Procedure: Laser Ablation: right Greater Saphenous Vein  BP 171/80  Pulse 80  Resp 16  Ht 5\' 4"  (1.626 m)  Wt 147 lb (66.679 kg)  BMI 25.23 kg/m2  Start time: 2:40   End time: 3:45  Tumescent Anesthesia: 35 cc 0.9% NaCl with 50 cc Lidocaine HCL with 1% Epi and 15 cc 8.4% NaHCO3  Local Anesthesia: 9 cc Lidocaine HCL and NaHCO3 (ratio 2:1)  Pulsed mode: Watts 15 Seconds 1 Pulses:1 Total Pulses:163 Total Energy: 2424 Total Time: 2:41    Stab Phlebectomy: 10-202 Sites: Calf  Patient tolerated procedure well: Yes  Notes:   Description of Procedure:  After marking the course of the saphenous vein and the secondary varicosities in the standing position, the patient was placed on the operating table in the supine position, and the right leg was prepped and draped in sterile fashion. Local anesthetic was administered, and under ultrasound guidance the saphenous vein was accessed with a micro needle and guide wire; then the micro puncture sheath was placed. A guide wire was inserted to the saphenofemoral junction, followed by a 5 french sheath.  The position of the sheath and then the laser fiber below the junction was confirmed using the ultrasound and visualization of the aiming beam.  Tumescent anesthesia was administered along the course of the saphenous vein using ultrasound guidance. Protective laser glasses were placed on the patient, and the laser was fired at 15 watt pulsed mode advancing 1-2 mm per sec.  For a total of 2424 joules.  A steri strip was applied to the puncture site.  The patient was then put into Trendelenburg position.  Local anesthetic was utilized overlying the marked varicosities.  Ten to 20 stab wounds were made using the tip of an 11 blade; and using the vein hook,  The phlebectomies were performed using a hemostat to avulse these varicosities.   Adequate hemostasis was achieved, and steri strips were applied to the stab wound.      ABD pads and thigh high compression stockings were applied.  Ace wrap bandages were applied over the phlebectomy sites and at the top of the saphenofemoral junction.  Blood loss was less than 15 cc.  The patient ambulated out of the operating room having tolerated the procedure well.

## 2012-07-09 NOTE — Progress Notes (Signed)
Subjective:     Patient ID: Donna Hamilton, female   DOB: 1950-12-26, 62 y.o.   MRN: [id number]  HPI this 62 year old female had laser ablation of the right great saphenous vein from the mid calf to the saphenofemoral junction and 10-20 stab phlebectomy performed under local tumescent anesthesia. She tolerated the procedures well.  Review of Systems     Objective:   Physical ExamBP 171/80  Pulse 80  Resp 16  Ht 5\' 4"  (1.626 m)  Wt 147 lb (66.679 kg)  BMI 25.23 kg/m2      Assessment:     Well-tolerated laser ablation right great saphenous vein with 10-20 stab phlebectomy of painful varicosities performed under local tumescent anesthesia    Plan:     Return in one week for venous duplex exam right leg to confirm closure right great saphenous vein

## 2012-07-10 ENCOUNTER — Encounter: Payer: Self-pay | Admitting: Vascular Surgery

## 2012-07-16 ENCOUNTER — Encounter: Payer: Self-pay | Admitting: Vascular Surgery

## 2012-07-17 ENCOUNTER — Encounter: Payer: Self-pay | Admitting: Vascular Surgery

## 2012-07-17 ENCOUNTER — Encounter (INDEPENDENT_AMBULATORY_CARE_PROVIDER_SITE_OTHER): Payer: 59 | Admitting: *Deleted

## 2012-07-17 ENCOUNTER — Ambulatory Visit (INDEPENDENT_AMBULATORY_CARE_PROVIDER_SITE_OTHER): Payer: 59 | Admitting: Vascular Surgery

## 2012-07-17 VITALS — BP 126/83 | HR 66 | Resp 18 | Ht 64.0 in | Wt 147.0 lb

## 2012-07-17 DIAGNOSIS — I83893 Varicose veins of bilateral lower extremities with other complications: Secondary | ICD-10-CM

## 2012-07-17 DIAGNOSIS — Z48812 Encounter for surgical aftercare following surgery on the circulatory system: Secondary | ICD-10-CM

## 2012-07-17 NOTE — Progress Notes (Signed)
Subjective:     Patient ID: Donna Hamilton, female   DOB: 16-Mar-1951, 62 y.o.   MRN: [id number]  HPI this 62 year old female returns 1 week post laser ablation right great saphenous vein with multiple stab phlebectomy for secondary painful varicosities performed under local tumescent anesthesia. She tolerated the procedure well last week. She has had mild discomfort along the course of the great saphenous vein from the knee to the saphenofemoral junction. She's had no distal edema. She's had no pain and stab phlebectomy sites. She is wearing elastic compression stockings and taking ibuprofen as prescribed until yesterday.  Past Medical History  Diagnosis Date  . Arthritis   . Anxiety   . Depression   . Breast cancer 2002    left breast  . Hx of adenomatous colonic polyps 2005    [place]    History  Substance Use Topics  . Smoking status: Former Smoker -- 1.0 packs/day for 30 years    Types: Cigarettes    Quit date: 10/28/2010  . Smokeless tobacco: Never Used  . Alcohol Use: 4.2 oz/week    7 Glasses of wine per week    Family History  Problem Relation Age of Onset  . Colon cancer Neg Hx   . Stomach cancer Neg Hx   . Other Father     Varicose Veins  . Heart disease Mother   . Hypertension Mother   . Other Mother     Varicose Veins  . Hypertension Brother     No Known Allergies  Current outpatient prescriptions:ALPRAZolam (XANAX) 0.25 MG tablet, Take 0.25 mg by mouth. Takes 1/2 of tablet prn for sleep, Disp: , Rfl: ;  atorvastatin (LIPITOR) 20 MG tablet, Take 20 mg by mouth daily., Disp: , Rfl: ;  Calcium Carbonate-Vitamin D (CALTRATE 600+D PO), Take by mouth. Takes 1 tablets daily, Disp: , Rfl: ;  Cholecalciferol (VITAMIN D3) 2000 UNITS TABS, Take by mouth. Take 2 daily, Disp: , Rfl:  citalopram (CELEXA) 20 MG tablet, Take 20 mg by mouth daily. Takes 3 tablets daily, Disp: , Rfl: ;  fenofibrate 160 MG tablet, Take 160 mg by mouth daily., Disp: , Rfl: ;  Glucosamine HCl-Glucosamin  SO4 (GLUCOSAMINE COMPLEX PO), Take by mouth. Takes 2 tablets daily, Disp: , Rfl: ;  niacinamide 500 MG tablet, Take 500 mg by mouth. Takes 2 tablets daily, Disp: , Rfl:  omega-3 acid ethyl esters (LOVAZA) 1 G capsule, Take 1 g by mouth 2 (two) times daily., Disp: , Rfl: ;  QUEtiapine (SEROQUEL) 25 MG tablet, Take 25 mg by mouth at bedtime., Disp: , Rfl:   BP 126/83  Pulse 66  Resp 18  Ht 5\' 4"  (1.626 m)  Wt 147 lb (66.679 kg)  BMI 25.23 kg/m2  Body mass index is 25.23 kg/(m^2).         Review of Systems denies chest pain, dyspnea on exertion, PND, orthopnea, hemoptysis, claudication    Objective:   Physical Exam blood pressure 126/83 heart rate 66 respirations 18 General well-developed well-nourished female in no apparent stress alert and oriented x3 Lungs no rhonchi or wheezing Right lower extremity with mild ecchymosis along the course of great saphenous vein from groin to knee. No distal edema. Stab phlebectomy sites healing well. 2+ dorsalis pedis pulse palpable.  Today I ordered a venous duplex exam of the right leg which are reviewed and interpreted. The great saphenous vein is totally closed from 1.1 cm from the saphenofemoral junction to the distal thigh and there is  no DVT    Assessment:     Successful laser ablation right great saphenous vein and multiple stab phlebectomy for painful varicosities    Plan:     Return February 24 for  laser ablation left small saphenous vein and multiple stab phlebectomy

## 2012-08-04 ENCOUNTER — Other Ambulatory Visit: Payer: Self-pay

## 2012-08-07 ENCOUNTER — Other Ambulatory Visit: Payer: Self-pay | Admitting: *Deleted

## 2012-08-07 DIAGNOSIS — I83893 Varicose veins of bilateral lower extremities with other complications: Secondary | ICD-10-CM

## 2012-08-10 ENCOUNTER — Encounter: Payer: Self-pay | Admitting: Vascular Surgery

## 2012-08-13 ENCOUNTER — Encounter: Payer: Self-pay | Admitting: Vascular Surgery

## 2012-08-13 ENCOUNTER — Ambulatory Visit (INDEPENDENT_AMBULATORY_CARE_PROVIDER_SITE_OTHER): Payer: 59 | Admitting: Vascular Surgery

## 2012-08-13 VITALS — BP 158/77 | HR 64 | Resp 20 | Ht 64.0 in | Wt 146.0 lb

## 2012-08-13 DIAGNOSIS — I83893 Varicose veins of bilateral lower extremities with other complications: Secondary | ICD-10-CM

## 2012-08-13 NOTE — Progress Notes (Signed)
Laser Ablation Procedure      Date: 08/13/2012    [name] DOB:April 23, 1951  Consent signed: Yes  Surgeon:J.D. Hart [place]  Procedure: Laser Ablation: left Small Saphenous Vein  BP 158/77  Pulse 64  Resp 20  Ht 5\' 4"  (1.626 m)  Wt 146 lb (66.225 kg)  BMI 25.05 kg/m2  Start time: 3:00   End time: 4:00  Tumescent Anesthesia: 200 cc 0.9% NaCl with 50 cc Lidocaine HCL with 1% Epi and 15 cc 8.4% NaHCO3  Local Anesthesia: 8 cc Lidocaine HCL and NaHCO3 (ratio 2:1)  Pulsed mode:yes Watts 15 Seconds 1 Pulses:1 Total Pulses:56 Total Energy: 840 Total Time: :56     Stab Phlebectomy: 10-20 Sites: Calf  Patient tolerated procedure well: Yes  Notes:   Description of Procedure:  After marking the course of the saphenous vein and the secondary varicosities in the standing position, the patient was placed on the operating table in the prone position, and the left leg was prepped and draped in sterile fashion. Local anesthetic was administered, and under ultrasound guidance the saphenous vein was accessed with a micro needle and guide wire; then the micro puncture sheath was placed. A guide wire was inserted to the saphenopopliteal junction, followed by a 5 french sheath.  The position of the sheath and then the laser fiber below the junction was confirmed using the ultrasound and visualization of the aiming beam.  Tumescent anesthesia was administered along the course of the saphenous vein using ultrasound guidance. Protective laser glasses were placed on the patient, and the laser was fired at 15 watt pulsed mode advancing 1-2 mm per sec.  For a total of 840 joules.  A steri strip was applied to the puncture site.  The patient was then put into Trendelenburg position.  Local anesthetic was utilized overlying the marked varicosities.  Ten to 20 stab wounds were made using the tip of an 11 blade; and using the vein hook,  The phlebectomies were performed using a hemostat to avulse these varicosities.   Adequate hemostasis was achieved, and steri strips were applied to the stab wound.      ABD pads and thigh high compression stockings were applied.  Ace wrap bandages were applied over the phlebectomy sites and at the top of the saphenopopliteal junction.  Blood loss was less than 15 cc.  The patient ambulated out of the operating room having tolerated the procedure well.

## 2012-08-13 NOTE — Progress Notes (Signed)
Subjective:     Patient ID: Donna Hamilton, female   DOB: 22-Feb-1951, 62 y.o.   MRN: [id number]  HPIthis 62 year old female had laser ablation of the left small saphenous vein performed under local tumescent anesthesia with 10-20 stab phlebectomy of secondary varicosities. A total of 840 J of energy was utilized. She tolerated the procedure well.   Review of Systems     Objective:   Physical Exam BP 158/77  Pulse 64  Resp 20  Ht 5\' 4"  (1.626 m)  Wt 146 lb (66.225 kg)  BMI 25.05 kg/m2       Assessment:     Well-tolerated laser ablation left small saphenous vein with 10-20 stab phlebectomy performed under local tumescent anesthesia    Plan:     Return in one week for venous duplex exam to confirm closure left small saphenous

## 2012-08-20 ENCOUNTER — Encounter: Payer: Self-pay | Admitting: Vascular Surgery

## 2012-08-21 ENCOUNTER — Ambulatory Visit: Payer: 59 | Admitting: Vascular Surgery

## 2012-08-21 ENCOUNTER — Encounter (INDEPENDENT_AMBULATORY_CARE_PROVIDER_SITE_OTHER): Payer: 59 | Admitting: *Deleted

## 2012-08-21 ENCOUNTER — Encounter: Payer: Self-pay | Admitting: Vascular Surgery

## 2012-08-21 ENCOUNTER — Ambulatory Visit (INDEPENDENT_AMBULATORY_CARE_PROVIDER_SITE_OTHER): Payer: 59 | Admitting: Vascular Surgery

## 2012-08-21 VITALS — BP 146/72 | HR 74 | Resp 16 | Ht 64.0 in | Wt 146.0 lb

## 2012-08-21 DIAGNOSIS — Z48812 Encounter for surgical aftercare following surgery on the circulatory system: Secondary | ICD-10-CM

## 2012-08-21 DIAGNOSIS — I83893 Varicose veins of bilateral lower extremities with other complications: Secondary | ICD-10-CM

## 2012-08-21 DIAGNOSIS — M79609 Pain in unspecified limb: Secondary | ICD-10-CM

## 2012-08-21 NOTE — Progress Notes (Signed)
Subjective:     Patient ID: Donna Hamilton, female   DOB: [birth year], 62 y.o.   MRN: [id number]  HPIthis 62 year old female returns 1 week post laser ablation left small saphenous vein for painful varicosities do 2 small saphenous reflux. She had some moderate discomfort in the popliteal fossa following the procedure which has almost resolved. She has been wearing her long light elastic compression stockings which has improved her discomfort. She has had no change in distal edema. She has been taking ibuprofen as instructed in elevating her legs.  Past Medical History  Diagnosis Date  . Arthritis   . Anxiety   . Depression   . Breast cancer 2002    left breast  . Hx of adenomatous colonic polyps 2005    [place]    History  Substance Use Topics  . Smoking status: Former Smoker -- 1.00 packs/day for 30 years    Types: Cigarettes    Quit date: 10/28/2010  . Smokeless tobacco: Never Used  . Alcohol Use: 4.2 oz/week    7 Glasses of wine per week    Family History  Problem Relation Age of Onset  . Colon cancer Neg Hx   . Stomach cancer Neg Hx   . Other Father     Varicose Veins  . Heart disease Mother   . Hypertension Mother   . Other Mother     Varicose Veins  . Hypertension Brother     No Known Allergies  Current outpatient prescriptions:ALPRAZolam (XANAX) 0.25 MG tablet, Take 0.25 mg by mouth. Takes 1/2 of tablet prn for sleep, Disp: , Rfl: ;  atorvastatin (LIPITOR) 20 MG tablet, Take 20 mg by mouth daily., Disp: , Rfl: ;  Calcium Carbonate-Vitamin D (CALTRATE 600+D PO), Take by mouth. Takes 1 tablets daily, Disp: , Rfl: ;  Cholecalciferol (VITAMIN D3) 2000 UNITS TABS, Take by mouth. Take 2 daily, Disp: , Rfl:  citalopram (CELEXA) 20 MG tablet, Take 20 mg by mouth daily. Takes 3 tablets daily, Disp: , Rfl: ;  fenofibrate 160 MG tablet, Take 160 mg by mouth daily., Disp: , Rfl: ;  Glucosamine HCl-Glucosamin SO4 (GLUCOSAMINE COMPLEX PO), Take by mouth. Takes 2 tablets daily, Disp: ,  Rfl: ;  niacinamide 500 MG tablet, Take 500 mg by mouth. Takes 2 tablets daily, Disp: , Rfl:  omega-3 acid ethyl esters (LOVAZA) 1 G capsule, Take 1 g by mouth 2 (two) times daily., Disp: , Rfl: ;  QUEtiapine (SEROQUEL) 25 MG tablet, Take 25 mg by mouth at bedtime., Disp: , Rfl:   BP 146/72  Pulse 74  Resp 16  Ht 5\' 4"  (1.626 m)  Wt 146 lb (66.225 kg)  BMI 25.05 kg/m2  Body mass index is 25.05 kg/(m^2).           Review of Systems Denies chest pain, dyspnea on exertion, PND, orthopnea, amoxicillin      Objective:   Physical Exam blood pressure 146/72 heart rate 74 respirations 16 General well-developed well-nourished female in no apparent stress alert and oriented x3 Lungs no rhonchi or wheezing Left leg with mild tenderness along course of the small saphenous vein from mid calf to popliteal fossa. Minimal distal edema noted. Stab phlebectomy sites healing nicely.  Today I ordered a venous duplex exam of the left leg which are reviewed and interpreted and confirmed by bedside SonoSite exam independently. The small saphenous vein is totally closed with some nonocclusive thrombus does protrude into the popliteal vein at the saphenofemoral popliteal junction. There  is no DVT.      Assessment:     Successful laser ablation left small saphenous vein with protrusion of thrombus into popliteal vein at saphenofemoral junction-asymptomatic     Plan:     #1 daily aspirin #2 continue long-leg elastic compression stockings #3 return in 3-4 weeks with repeat duplex scan left leg-hopefully this thrombus will have resolved at that time If patient develops any chest pain, hemoptysis, or dyspnea on exertion will be in touch with [place] immediately for duplex exam

## 2012-08-21 NOTE — Addendum Note (Signed)
Addended by: Sharee Pimple on: 08/21/2012 03:08 PM   Modules accepted: Orders

## 2012-09-07 ENCOUNTER — Encounter: Payer: Self-pay | Admitting: Vascular Surgery

## 2012-09-10 ENCOUNTER — Encounter: Payer: Self-pay | Admitting: Vascular Surgery

## 2012-09-10 ENCOUNTER — Encounter (INDEPENDENT_AMBULATORY_CARE_PROVIDER_SITE_OTHER): Payer: 59 | Admitting: *Deleted

## 2012-09-10 ENCOUNTER — Ambulatory Visit (INDEPENDENT_AMBULATORY_CARE_PROVIDER_SITE_OTHER): Payer: 59 | Admitting: Vascular Surgery

## 2012-09-10 VITALS — BP 135/75 | HR 70 | Resp 16 | Ht 64.0 in | Wt 146.0 lb

## 2012-09-10 DIAGNOSIS — I83893 Varicose veins of bilateral lower extremities with other complications: Secondary | ICD-10-CM

## 2012-09-10 DIAGNOSIS — Z48812 Encounter for surgical aftercare following surgery on the circulatory system: Secondary | ICD-10-CM

## 2012-09-10 DIAGNOSIS — M79609 Pain in unspecified limb: Secondary | ICD-10-CM

## 2012-09-10 NOTE — Progress Notes (Signed)
Subjective:     Patient ID: Donna Hamilton, female   DOB: 05-Nov-1950, 62 y.o.   MRN: [id number]  HPIthis 62 year old female returns for continued followup regarding her laser ablation of the left small saphenous vein which was performed approximately 4 weeks ago. She had a venous duplex exam on 08/21/2012 which revealed some protrusion of thrombus in the popliteal vein at the saphenofemoral popliteal junction. She has had no edema in the left leg since then and has had no chest pain, shortness of breath, hemoptysis, or other pulmonary symptoms. She states both legs feel fine with improved edema since her procedures.  Past Medical History  Diagnosis Date  . Arthritis   . Anxiety   . Depression   . Breast cancer 2002    left breast  . Hx of adenomatous colonic polyps 2005    [place]    History  Substance Use Topics  . Smoking status: Former Smoker -- 1.00 packs/day for 30 years    Types: Cigarettes    Quit date: 10/28/2010  . Smokeless tobacco: Never Used  . Alcohol Use: 4.2 oz/week    7 Glasses of wine per week    Family History  Problem Relation Age of Onset  . Colon cancer Neg Hx   . Stomach cancer Neg Hx   . Other Father     Varicose Veins  . Heart disease Mother   . Hypertension Mother   . Other Mother     Varicose Veins  . Hypertension Brother     No Known Allergies  Current outpatient prescriptions:ALPRAZolam (XANAX) 0.25 MG tablet, Take 0.25 mg by mouth. Takes 1/2 of tablet prn for sleep, Disp: , Rfl: ;  atorvastatin (LIPITOR) 20 MG tablet, Take 20 mg by mouth daily., Disp: , Rfl: ;  Calcium Carbonate-Vitamin D (CALTRATE 600+D PO), Take by mouth. Takes 1 tablets daily, Disp: , Rfl: ;  Cholecalciferol (VITAMIN D3) 2000 UNITS TABS, Take by mouth. Take 2 daily, Disp: , Rfl:  citalopram (CELEXA) 20 MG tablet, Take 20 mg by mouth daily. Takes 3 tablets daily, Disp: , Rfl: ;  fenofibrate 160 MG tablet, Take 160 mg by mouth daily., Disp: , Rfl: ;  Glucosamine HCl-Glucosamin SO4  (GLUCOSAMINE COMPLEX PO), Take by mouth. Takes 2 tablets daily, Disp: , Rfl: ;  niacinamide 500 MG tablet, Take 500 mg by mouth. Takes 2 tablets daily, Disp: , Rfl:  omega-3 acid ethyl esters (LOVAZA) 1 G capsule, Take 1 g by mouth 2 (two) times daily., Disp: , Rfl: ;  QUEtiapine (SEROQUEL) 25 MG tablet, Take 25 mg by mouth at bedtime., Disp: , Rfl:   BP 135/75  Pulse 70  Resp 16  Ht 5\' 4"  (1.626 m)  Wt 146 lb (66.225 kg)  BMI 25.05 kg/m2  Body mass index is 25.05 kg/(m^2).           Review of Systems     Objective:   Physical Exam BP 135/75  Pulse 70  Resp 16  Ht 5\' 4"  (1.626 m)  Wt 146 lb (66.225 kg)  BMI 25.05 kg/m2  General well-developed well-nourished female in no apparent stress alert and oriented x3 Left lower extremity with mild discomfort along the course of small saphenous vein-no distal edema-well-healed stab phlebectomy sites and posterior calf  Today I ordered a venous duplex exam left leg which are reviewed and interpreted. Left small saphenous vein continues to be totally ablated and there is no thrombus intruding into the popliteal vein at this time-no DVT  Assessment:     Doing well post bilateral laser ablation right great saphenous vein in left small saphenous veins for venous hypertension and distal edema with varicosities     Plan:     Return to see [place] on a when necessary basis

## 2012-09-26 ENCOUNTER — Encounter (INDEPENDENT_AMBULATORY_CARE_PROVIDER_SITE_OTHER): Payer: 59 | Admitting: *Deleted

## 2012-09-26 DIAGNOSIS — I83893 Varicose veins of bilateral lower extremities with other complications: Secondary | ICD-10-CM

## 2012-11-02 ENCOUNTER — Other Ambulatory Visit (HOSPITAL_BASED_OUTPATIENT_CLINIC_OR_DEPARTMENT_OTHER): Payer: 59 | Admitting: Lab

## 2012-11-02 ENCOUNTER — Ambulatory Visit (HOSPITAL_BASED_OUTPATIENT_CLINIC_OR_DEPARTMENT_OTHER): Payer: 59 | Admitting: Oncology

## 2012-11-02 ENCOUNTER — Encounter: Payer: Self-pay | Admitting: Oncology

## 2012-11-02 VITALS — BP 140/80 | HR 74 | Temp 97.4°F | Resp 18 | Ht 64.0 in | Wt 145.1 lb

## 2012-11-02 DIAGNOSIS — C50912 Malignant neoplasm of unspecified site of left female breast: Secondary | ICD-10-CM

## 2012-11-02 DIAGNOSIS — C50919 Malignant neoplasm of unspecified site of unspecified female breast: Secondary | ICD-10-CM

## 2012-11-02 DIAGNOSIS — E559 Vitamin D deficiency, unspecified: Secondary | ICD-10-CM

## 2012-11-02 LAB — CBC WITH DIFFERENTIAL/PLATELET
Basophils Absolute: 0.1 10*3/uL (ref 0.0–0.1)
EOS%: 3.3 % (ref 0.0–7.0)
Eosinophils Absolute: 0.2 10*3/uL (ref 0.0–0.5)
HCT: 39.9 % (ref 34.8–46.6)
HGB: 14 g/dL (ref 11.6–15.9)
MCH: 33.5 pg (ref 25.1–34.0)
MCV: 95.6 fL (ref 79.5–101.0)
NEUT#: 3.2 10*3/uL (ref 1.5–6.5)
NEUT%: 48.6 % (ref 38.4–76.8)
lymph#: 2.5 10*3/uL (ref 0.9–3.3)

## 2012-11-02 LAB — COMPREHENSIVE METABOLIC PANEL (CC13)
ALT: 29 U/L (ref 0–55)
Alkaline Phosphatase: 72 U/L (ref 40–150)
CO2: 23 mEq/L (ref 22–29)
Creatinine: 0.7 mg/dL (ref 0.6–1.1)
Total Bilirubin: 0.42 mg/dL (ref 0.20–1.20)

## 2012-11-02 LAB — LACTATE DEHYDROGENASE (CC13): LDH: 196 U/L (ref 125–245)

## 2012-11-02 NOTE — Progress Notes (Signed)
This office note has been dictated.  #[id number]

## 2012-11-03 NOTE — Progress Notes (Signed)
CC:   Mark A. Perini, M.D.  PROBLEM LIST: 1. Left-sided breast cancer, 0.8 cm, with 1 micrometastasis in 10     lymph nodes, stage T1b, IB, treated in [place], [place], with     diagnosis established in February 2002.  The patient underwent a     left modified radical mastectomy and TRAM reconstruction on     08/12/2000.  She had a right-sided Port-A-Cath and received 4     cycles of Adriamycin/Cytoxan, followed x 4 cycles of Taxotere.  The     patient received 2 years of tamoxifen and subsequently Arimidex     from 2004 through June 2012.  Estrogen and progesterone receptors     were positive.  HER2/neu was negative.  The patient remains disease-     free without recurrence. 2. Vitamin D deficiency. 3. Depression/anxiety. 4. Dyslipidemia.  MEDICATIONS:  Reviewed and recorded. Current Outpatient Prescriptions  Medication Sig Dispense Refill  . ALPRAZolam (XANAX) 0.25 MG tablet Take 0.25 mg by mouth. Takes 1/2 of tablet prn for sleep      . atorvastatin (LIPITOR) 20 MG tablet Take 20 mg by mouth daily.      . Calcium Carbonate-Vitamin D (CALTRATE 600+D PO) Take by mouth. Takes 1 tablets daily      . Cholecalciferol (VITAMIN D3) 2000 UNITS TABS Take by mouth. Take 2 daily      . citalopram (CELEXA) 20 MG tablet Take 20 mg by mouth daily. Takes 3 tablets daily      . fenofibrate 160 MG tablet Take 160 mg by mouth daily.      . Glucosamine HCl-Glucosamin SO4 (GLUCOSAMINE COMPLEX PO) Take by mouth. Takes 2 tablets daily      . niacinamide 500 MG tablet Take 500 mg by mouth. Takes 2 tablets daily      . omega-3 acid ethyl esters (LOVAZA) 1 G capsule Take 1 g by mouth 2 (two) times daily.      . QUEtiapine (SEROQUEL) 25 MG tablet Take 25 mg by mouth at bedtime.       No current facility-administered medications for this visit.     SMOKING HISTORY:  The patient has smoked cigarettes since age 58, up to 1-1/2 packs a day for 30 years.  She still smokes a couple cigarettes a day and is  trying to stop smoking at this time.  HISTORY:  The patient is a 62 year old white female here for yearly visit regarding her left-sided breast cancer which dates back to February 2002, i.e., 12 years ago.  The patient remains disease-free and is on no therapy.  She was last seen by [place] on 10/28/2011.  Her last mammogram was on 12/26/2011.  The patient was doing well up until about 3 weeks ago when she had a painful swelling in the region of her left axilla.  That swelling lasted for a couple weeks, but has completely resolved.  The patient is asymptomatic at the present time without any symptoms to suggest recurrent disease.  She had a colonoscopy approximately 4 or 5 years ago.  Last chest x-ray was in May 2012 in the office of Dr. Waynard Edwards.  PHYSICAL EXAMINATION:  General:  The patient looks well.  Weight is 145 pounds 1.6 ounces, height 5 feet 4 inches, body surface area 1.72 sq m. Vital Signs:  Blood pressure 140/80.  Other vital signs are normal. HEENT:  There is no scleral icterus.  Mouth and pharynx are benign.  No peripheral adenopathy palpable.  Heart  and lungs:  Normal.  Rhythm is regular without murmur.  There is no axillary adenopathy or any abnormalities.  Breasts:  Right breast is benign.  Left breast has undergone a mastectomy, but reconstructed with a TRAM procedure.  The reconstruction is quite good.  The nipple-areolar complex has also been reconstructed.  There are no suspicious findings.  Abdomen:  Benign with no organomegaly or masses palpable.  She has surgical scars from her TRAM, also a well-healed scar from her port which was removed.  No peripheral edema, clubbing.  Neurologic:  Exam is normal.  LABORATORY DATA:  Today, white count 6.5, ANC 3.2, hemoglobin 14.0, hematocrit 39.9, platelets 257,000.  Chemistries today were normal except for an AST of 38 with normal being 5-34.  IMAGING STUDIES:  Digital screening mammogram carried out on 12/26/2011 was negative.   There were scattered fibroglandular densities in the right breast.  It was noted that the patient had undergone a left mastectomy and TRAM.  IMPRESSION AND PLAN:  This patient is doing well without evidence of recurrence on no therapy which we discontinued in June 2012.  I first saw this patient in early June 2012.  She had received all of her treatment in [place], [place], and had relocated to [place].  She works over at the [employer] and [employer].  There are no findings on physical exam and the patient is asymptomatic at this time.  I suggested that if she has a recurrence of any problems with her left breast or axilla that we would be happy to see her. Otherwise, she will have repeat mammogram of her right breast, possibly the reconstructed left breast, in July 2014.  At this point, I do not think it is necessary for [place] to see [place], but we would be happy to see her if any questions or concerns arise in the future.    ______________________________ Samul Dada, M.D. DSM/MEDQ  D:  11/02/2012  T:  11/03/2012  Job:  [id number]

## 2012-11-20 ENCOUNTER — Encounter: Payer: Self-pay | Admitting: *Deleted

## 2012-11-21 ENCOUNTER — Ambulatory Visit (INDEPENDENT_AMBULATORY_CARE_PROVIDER_SITE_OTHER): Payer: 59 | Admitting: *Deleted

## 2012-11-21 DIAGNOSIS — I83893 Varicose veins of bilateral lower extremities with other complications: Secondary | ICD-10-CM

## 2012-11-21 DIAGNOSIS — I781 Nevus, non-neoplastic: Secondary | ICD-10-CM

## 2012-11-21 NOTE — Progress Notes (Signed)
X=.3% Sotradecol administered with a 27g butterfly.  Patient received a total of 15cc.  Treated all areas of concern. Easy access. Tol well. Will follow prn.  Photos: yes  Compression stockings applied: yes

## 2012-12-14 ENCOUNTER — Other Ambulatory Visit: Payer: Self-pay

## 2012-12-14 DIAGNOSIS — Z1231 Encounter for screening mammogram for malignant neoplasm of breast: Secondary | ICD-10-CM

## 2012-12-27 ENCOUNTER — Ambulatory Visit: Payer: 59

## 2013-01-14 ENCOUNTER — Ambulatory Visit: Payer: 59

## 2013-02-04 ENCOUNTER — Ambulatory Visit
Admission: RE | Admit: 2013-02-04 | Discharge: 2013-02-04 | Disposition: A | Discharge status: Home / Self Care | Payer: 59 | Source: Ambulatory Visit

## 2013-02-04 ENCOUNTER — Other Ambulatory Visit: Payer: Self-pay

## 2013-02-04 DIAGNOSIS — Z1231 Encounter for screening mammogram for malignant neoplasm of breast: Secondary | ICD-10-CM

## 2013-04-25 ENCOUNTER — Other Ambulatory Visit: Payer: Self-pay

## 2013-04-30 ENCOUNTER — Encounter: Payer: Self-pay | Admitting: Internal Medicine

## 2013-05-02 ENCOUNTER — Ambulatory Visit: Payer: 59 | Admitting: Gastroenterology

## 2013-05-02 ENCOUNTER — Other Ambulatory Visit (INDEPENDENT_AMBULATORY_CARE_PROVIDER_SITE_OTHER): Payer: 59

## 2013-05-02 ENCOUNTER — Ambulatory Visit (INDEPENDENT_AMBULATORY_CARE_PROVIDER_SITE_OTHER): Payer: 59 | Admitting: Gastroenterology

## 2013-05-02 ENCOUNTER — Encounter: Payer: Self-pay | Admitting: Gastroenterology

## 2013-05-02 VITALS — BP 110/60 | HR 70 | Ht 64.0 in | Wt 142.8 lb

## 2013-05-02 DIAGNOSIS — K52832 Lymphocytic colitis: Secondary | ICD-10-CM

## 2013-05-02 DIAGNOSIS — R197 Diarrhea, unspecified: Secondary | ICD-10-CM

## 2013-05-02 HISTORY — DX: Lymphocytic colitis: K52.832

## 2013-05-02 LAB — BASIC METABOLIC PANEL
BUN: 15 mg/dL (ref 6–23)
Calcium: 9.3 mg/dL (ref 8.4–10.5)
Creatinine, Ser: 0.6 mg/dL (ref 0.4–1.2)
GFR: 102.24 mL/min (ref >60.00)
Glucose, Bld: 97 mg/dL (ref 70–99)

## 2013-05-02 MED ORDER — MOVIPREP 100 G PO SOLR: 1.0000 | Freq: Once | ORAL | Status: DC

## 2013-05-02 MED ORDER — ESOMEPRAZOLE MAGNESIUM 20 MG PO PACK: 20.0000 mg | PACK | Freq: Every day | ORAL | Status: DC

## 2013-05-02 NOTE — Progress Notes (Signed)
Agree w/ Ms. Zehr's management. 

## 2013-05-02 NOTE — Patient Instructions (Signed)
Your physician has requested that you go to the basement for lab work before leaving today. You have been scheduled for a colonoscopy with propofol. Please follow written instructions given to you at your visit today.  Please pick up your prep kit at the pharmacy within the next 1-3 days. If you use inhalers (even only as needed), please bring them with you on the day of your procedure. Your physician has requested that you go to www.startemmi.com and enter the access code given to you at your visit today. This web site gives a general overview about your procedure. However, you should still follow specific instructions given to you by our office regarding your preparation for the procedure. You can use Imodium as needed. Start taking Florastor once daily, we have given you samples. Finish your Flagyl. We have sent a Nexium prescription to your pharmacy. CC:  Rodrigo Ran MD

## 2013-05-02 NOTE — Progress Notes (Signed)
05/02/2013 Donna Hamilton [id number] 1951-06-04   HISTORY OF PRESENT ILLNESS:  This is a pleasant 62 year old female who is new to our practice, but previously accepted by Dr. Leone Payor as an new patient.  She presents to our office today with complaints of diarrhea for the last 4 weeks. She states that when this began it was abruptly in the middle of the night and she was having diarrhea every half an hour. This has been persistent since that time with diarrheal stools up to 16 times a day, which are loose and watery in consistency.  This has been associated with some lower abdominal cramping, but no persistent abdominal pains. She denies any fevers or blood in her stool. She saw her PCP who ordered stool studies, which were negative for C. difficile, ova and parasites, white blood cells, Salmonella and Shigella, Campylobacter, Ecoli, and fecal fat.  She also states CBC was drawn that was normal as well, but I do not have those results. She was placed empirically on Flagyl 500 mg 3 times daily, which she just began on Monday. She says that yesterday was her best day so far with only 8 bowel movements that day.  She already had 4 bowel movements today. She denies any recent antibiotic use, new medications, travel. She's never had any episodes similar to this in the past. Prior to this starting she was having 3 solid bowel movements a day with no GI complaints. She has also started a probiotic, Restora, that her PCP give her samples of. Her last colonoscopy was in January 2010 at which time it was normal, but after reviewing her chart Dr. Leone Payor recommended she have a colonoscopy again in 5 years, which should be January 2015.     Past Medical History  Diagnosis Date  . Arthritis   . Anxiety   . Depression   . Breast cancer 2002    left breast  . Hx of adenomatous colonic polyps 2005    [place]  . Hyperlipidemia    Past Surgical History  Procedure Laterality Date  . Mastectomy  2002    left  .  Tonsillectomy    . Reconstruction breast w/ tram flap  2002    reports that she has been smoking Cigarettes.  She has a 30 pack-year smoking history. She has never used smokeless tobacco. She reports that she drinks about 4.2 ounces of alcohol per week. She reports that she does not use illicit drugs. family history includes Heart disease in her mother; Hypertension in her brother and mother; Other in her father and mother. There is no history of Colon cancer or Stomach cancer. No Known Allergies    Outpatient Encounter Prescriptions as of 05/02/2013  Medication Sig  . ALPRAZolam (XANAX) 0.25 MG tablet Take 0.25 mg by mouth. Takes 1/2 of tablet prn for sleep  . atorvastatin (LIPITOR) 20 MG tablet Take 20 mg by mouth daily.  . Calcium Carbonate-Vitamin D (CALTRATE 600+D PO) Take by mouth. Takes 1 tablets daily  . Cholecalciferol (VITAMIN D3) 2000 UNITS TABS Take by mouth. Take 2 daily  . citalopram (CELEXA) 20 MG tablet Take 20 mg by mouth daily. Takes 3 tablets daily  . esomeprazole (NEXIUM) 20 MG capsule Take 20 mg by mouth daily at 12 noon.  . fenofibrate 160 MG tablet Take 160 mg by mouth daily.  . Glucosamine HCl-Glucosamin SO4 (GLUCOSAMINE COMPLEX PO) Take by mouth. Takes 2 tablets daily  . metroNIDAZOLE (FLAGYL) 500 MG tablet Take 500 mg by mouth  3 (three) times daily.  . niacinamide 500 MG tablet Take 500 mg by mouth. Takes 2 tablets daily  . omega-3 acid ethyl esters (LOVAZA) 1 G capsule Take 1 g by mouth 2 (two) times daily.  . Probiotic Product (RESTORA PO) Take 1 capsule by mouth daily.  . QUEtiapine (SEROQUEL) 25 MG tablet Take 25 mg by mouth at bedtime.  Marland Kitchen esomeprazole (NEXIUM) 20 MG packet Take 20 mg by mouth daily before breakfast.  . MOVIPREP 100 G SOLR Take 1 kit (200 g total) by mouth once.     REVIEW OF SYSTEMS  : All other systems reviewed and negative except where noted in the History of Present Illness.   PHYSICAL EXAM: BP 110/60  Pulse 70  Ht 5\' 4"  (1.626 m)   Wt 142 lb 12.8 oz (64.774 kg)  BMI 24.50 kg/m2 General: Well developed white female in no acute distress Head: Normocephalic and atraumatic Eyes  Ssclerae anicteric, conjunctiva pink. Ears: Normal auditory acuity.  Lungs: Clear throughout to auscultation Heart: Regular rate and rhythm. Abdomen: Soft, non-distended.  BS present and hyperactive.  Non-tender. Rectal: Deferred.  Will be done at the time of colonoscopy. Musculoskeletal: Symmetrical with no gross deformities  Skin: No lesions on visible extremities Extremities: No edema  Neurological: Alert oriented x 4, grossly non-focal Psychological:  Alert and cooperative. Normal mood and affect  ASSESSMENT AND PLAN: -Diarrhea, acute to subacute:  Sounds infectious by history, but has now been going on for 4 weeks without much improvement at this point. She will continue the course of Flagyl that she has been given by her PCP. She will also continue a probiotic and we will give her samples of Florastor to use, which she can then get OTC as well.  She can use Imodium as needed. We will check labs including BMP, TSH, sedimentation rate, CRP, and TTG/IgA to rule out celiac. We will schedule her for colonoscopy as well since she is due for that procedure in January anyway, and this may be the next step for evaluation if her diarrhea continues. May need biopsies to rule out microscopic colitis if colon otherwise looks normal.  The risks, benefits, and alternatives were discussed with the patient and she consents to proceed.  **She was started on Nexium 20 mg daily via samples by her PCP for complaints of epigastric burning as well.  She says that the medication is helping, and she is asking if we can send a prescription for that medication. We will send this to her pharmacy.

## 2013-05-03 ENCOUNTER — Telehealth: Payer: Self-pay | Admitting: Internal Medicine

## 2013-05-03 NOTE — Telephone Encounter (Signed)
Left message on her voicemail to call me back. 

## 2013-05-06 NOTE — Telephone Encounter (Signed)
Patient wants to know if you could do her colonoscopy your Dec. Hospital week, if so she will come back from [place] early.  Please advise if ok , she saw Doug Sou last week.  She reports feeling better this AM.

## 2013-05-07 NOTE — Telephone Encounter (Signed)
Not then but perhaps before in Dec when we have an opening in [facility]

## 2013-05-07 NOTE — Telephone Encounter (Signed)
Spoke with patient and have r/s'ed colon to 06/06/13 at 3:30pm.  Patient said no need for new paperwork as she will just change the times on the papers she already has.

## 2013-05-08 LAB — TISSUE TRANSGLUTAMINASE, IGA: Tissue Transglutaminase Ab, IgA: 3.1 U/mL (ref <20)

## 2013-05-10 ENCOUNTER — Encounter: Payer: Self-pay | Admitting: *Deleted

## 2013-05-28 ENCOUNTER — Encounter: Payer: Self-pay | Admitting: Internal Medicine

## 2013-05-29 NOTE — Telephone Encounter (Signed)
Left message for patient to call back  

## 2013-05-30 ENCOUNTER — Telehealth: Payer: Self-pay | Admitting: Internal Medicine

## 2013-05-30 NOTE — Telephone Encounter (Signed)
She called you back

## 2013-05-30 NOTE — Telephone Encounter (Signed)
I called the patient in response to her My Chart message.  She is still having diarrhea.  She is advised it is ok to take imodium as needed.  She has not been taking it, she was worried about about abusing it.  She is advised to keep her colonoscopy as scheduled and hold imodium starting on 06/04/13.  She will call or message back for any additional questions or concerns

## 2013-06-05 ENCOUNTER — Ambulatory Visit: Payer: 59 | Admitting: Internal Medicine

## 2013-06-06 ENCOUNTER — Ambulatory Visit (AMBULATORY_SURGERY_CENTER): Payer: 59 | Admitting: Internal Medicine

## 2013-06-06 ENCOUNTER — Encounter: Payer: Self-pay | Admitting: Internal Medicine

## 2013-06-06 VITALS — BP 152/71 | HR 71 | Temp 96.3°F | Resp 27 | Ht 64.0 in | Wt 142.0 lb

## 2013-06-06 DIAGNOSIS — R197 Diarrhea, unspecified: Secondary | ICD-10-CM

## 2013-06-06 MED ORDER — SODIUM CHLORIDE 0.9 % IV SOLN: 500.0000 mL | INTRAVENOUS | Status: DC

## 2013-06-06 NOTE — Patient Instructions (Addendum)
The colonoscopy was overall normal, some possible subtle changes in the lining but no smoking gun. I took multiple biopsies and hope to know something tomorrow. In the meantime I recommend you start taking EnteraGam - sample from my office provided.  We will call with results and plans.  I appreciate the opportunity to care for you. Iva Boop, MD, FACG   YOU HAD AN ENDOSCOPIC PROCEDURE TODAY AT THE [facility]: Refer to the procedure report that was given to you for any specific questions about what was found during the examination.  If the procedure report does not answer your questions, please call your gastroenterologist to clarify.  If you requested that your care partner not be given the details of your procedure findings, then the procedure report has been included in a sealed envelope for you to review at your convenience later.  YOU SHOULD EXPECT: Some feelings of bloating in the abdomen. Passage of more gas than usual.  Walking can help get rid of the air that was put into your GI tract during the procedure and reduce the bloating. If you had a lower endoscopy (such as a colonoscopy or flexible sigmoidoscopy) you may notice spotting of blood in your stool or on the toilet paper. If you underwent a bowel prep for your procedure, then you may not have a normal bowel movement for a few days.  DIET: Your first meal following the procedure should be a light meal and then it is ok to progress to your normal diet.  A half-sandwich or bowl of soup is an example of a good first meal.  Heavy or fried foods are harder to digest and may make you feel nauseous or bloated.  Likewise meals heavy in dairy and vegetables can cause extra gas to form and this can also increase the bloating.  Drink plenty of fluids but you should avoid alcoholic beverages for 24 hours.  ACTIVITY: Your care partner should take you home directly after the procedure.  You should plan to take it easy, moving  slowly for the rest of the day.  You can resume normal activity the day after the procedure however you should NOT DRIVE or use heavy machinery for 24 hours (because of the sedation medicines used during the test).    SYMPTOMS TO REPORT IMMEDIATELY: A gastroenterologist can be reached at any hour.  During normal business hours, 8:30 AM to 5:00 PM Monday through Friday, call [phone].  After hours and on weekends, please call the GI answering service at [phone] who will take a message and have the physician on call contact you.   Following lower endoscopy (colonoscopy or flexible sigmoidoscopy):  Excessive amounts of blood in the stool  Significant tenderness or worsening of abdominal pains  Swelling of the abdomen that is new, acute  Fever of 100F or higher  Following upper endoscopy (EGD)  Vomiting of blood or coffee ground material  New chest pain or pain under the shoulder blades  Painful or persistently difficult swallowing  New shortness of breath  Fever of 100F or higher  Black, tarry-looking stools  FOLLOW UP: If any biopsies were taken you will be contacted by phone or by letter within the next 1-3 weeks.  Call your gastroenterologist if you have not heard about the biopsies in 3 weeks.  Our staff will call the home number listed on your records the next business day following your procedure to check on you and address any questions or concerns  that you may have at that time regarding the information given to you following your procedure. This is a courtesy call and so if there is no answer at the home number and we have not heard from you through the emergency physician on call, we will assume that you have returned to your regular daily activities without incident.  SIGNATURES/CONFIDENTIALITY: You and/or your care partner have signed paperwork which will be entered into your electronic medical record.  These signatures attest to the fact that that the information  above on your After Visit Summary has been reviewed and is understood.  Full responsibility of the confidentiality of this discharge information lies with you and/or your care-partner.  Diverticulosis information given.  Use EnterGam twice daily per Dr. Marvell Fuller instructions.

## 2013-06-06 NOTE — Progress Notes (Signed)
Called to room to assist during endoscopic procedure.  Patient ID and intended procedure confirmed with present staff. Received instructions for my participation in the procedure from the performing physician.  

## 2013-06-06 NOTE — Op Note (Signed)
[facility] [address] [place], [address]   COLONOSCOPY PROCEDURE REPORT  PATIENT: Donna Hamilton, Donna Hamilton  MR#: [id number] BIRTHDATE: 1951/01/25 , 62  yrs. old GENDER: Female ENDOSCOPIST: Iva Boop, MD, [facility] REFERRED NG:EXBM Waynard Edwards, M.D. PROCEDURE DATE:  06/06/2013 PROCEDURE:   Colonoscopy with biopsy First Screening Colonoscopy - Avg.  risk and is 50 yrs.  old or older - No.  Prior Negative Screening - Now for repeat screening. N/A  History of Adenoma - Now for follow-up colonoscopy & has been > or = to 3 yrs.  Yes hx of adenoma.  Has been 3 or more years since last colonoscopy.  Polyps Removed Today? No.  Recommend repeat exam, <10 yrs? Yes.  High risk (family or personal hx). ASA CLASS:   Class II INDICATIONS:Chronic diarrhea and Unexplained diarrhea. MEDICATIONS: propofol (Diprivan) 350mg  IV, MAC sedation, administered by CRNA, and These medications were titrated to patient response per physician's verbal order  DESCRIPTION OF PROCEDURE:   After the risks benefits and alternatives of the procedure were thoroughly explained, informed consent was obtained.  A digital rectal exam revealed no abnormalities of the rectum.   The LB [id number]  endoscope was introduced through the anus and advanced to the terminal ileum which was intubated for a short distance. No adverse events experienced.   The quality of the prep was excellent, using MoviPrep  The instrument was then slowly withdrawn as the colon was fully examined.  COLON FINDINGS: The colonic mucosa appeared normal throughout the entire examined colon except for some patchy loss of vascular pattern.  Multiple random biopsies of the area were performed. The mucosa appeared normal in the terminal ileum.   Mild diverticulosis was noted in the sigmoid colon.  Retroflexed views revealed no abnormalities. The time to cecum=2 minutes 22 seconds. Withdrawal time=6 minutes 40 seconds.  The scope was withdrawn  and the procedure completed. COMPLICATIONS: There were no complications.  ENDOSCOPIC IMPRESSION: 1.   The colonic mucosa appeared normal throughout the entire examined colon except for some patchy loss of vascular pattern; multiple random biopsies of the area were performed 2.   Normal mucosa in the terminal ileum 3.   Mild diverticulosis was noted in the sigmoid colon  RECOMMENDATIONS: 1.  Await biopsy results 2.   Try EnterGam bid will call results/plans   eSigned:  Iva Boop, MD, [facility] 06/06/2013 4:29 PM cc: Rodrigo Ran, MD and The Patient

## 2013-06-06 NOTE — Progress Notes (Signed)
Lidocaine-40mg IV prior to Propofol InductionPropofol given over incremental dosages 

## 2013-06-06 NOTE — Progress Notes (Addendum)
No egg or soy allergy. ewm 

## 2013-06-06 NOTE — Progress Notes (Signed)
Pathology sent "rush" to pathology.

## 2013-06-07 ENCOUNTER — Telehealth: Payer: Self-pay | Admitting: Internal Medicine

## 2013-06-07 ENCOUNTER — Telehealth: Payer: Self-pay

## 2013-06-07 ENCOUNTER — Encounter: Payer: Self-pay | Admitting: Internal Medicine

## 2013-06-07 DIAGNOSIS — K52832 Lymphocytic colitis: Secondary | ICD-10-CM

## 2013-06-07 MED ORDER — PREDNISONE 10 MG PO TABS: ORAL_TABLET | ORAL | Status: DC

## 2013-06-07 NOTE — Telephone Encounter (Signed)
I called the patient and explained her diagnosis of lymphocytic colitis. We'll plan on prednisone taper. She will continue on other therapies. She is instructed to call me at the end of the prednisone treatment let me know how she is doing, and we'll determine need for followup at that time. I have explained that this can be an isolated self-limited problem or can be chronic and recurrent, time will tell. I will also send her a message with a diagnosis but now, in my chart.

## 2013-06-07 NOTE — Assessment & Plan Note (Signed)
Tx w/ prednisone/taper over 3-4 weeks

## 2013-06-07 NOTE — Telephone Encounter (Signed)
  Follow up Call-  Call back number 06/06/2013  Post procedure Call Back phone  # [phone]  Permission to leave phone message Yes     Patient questions:  Do you have a fever, pain , or abdominal swelling? no Pain Score  0 *  Have you tolerated food without any problems? yes  Have you been able to return to your normal activities? yes  Do you have any questions about your discharge instructions: Diet   no Medications  no Follow up visit  no  Do you have questions or concerns about your Care? no  Actions: * If pain score is 4 or above: No action needed, pain <4.

## 2013-06-08 NOTE — Progress Notes (Signed)
Quick Note:  I called results of lymphocytic colitis and prescribed prednisone taper. No letter Needed but place 10 year colon recall   ______

## 2013-06-24 ENCOUNTER — Encounter: Payer: Self-pay | Admitting: Internal Medicine

## 2013-06-25 ENCOUNTER — Telehealth: Payer: Self-pay

## 2013-06-25 ENCOUNTER — Encounter: Payer: Self-pay | Admitting: Internal Medicine

## 2013-06-25 DIAGNOSIS — K52832 Lymphocytic colitis: Secondary | ICD-10-CM

## 2013-06-25 NOTE — Telephone Encounter (Signed)
40 mg prednisone daily - will need longer course it seems ok to Rx 10 mg use as directed #100 no refill At 1 week call or message with # of bowel movements/update REV in 3-4 weeks approx

## 2013-06-25 NOTE — Telephone Encounter (Signed)
I have left a message for the patient to call back to discuss the My Chart Messages she sent

## 2013-06-25 NOTE — Telephone Encounter (Signed)
Patient reports initially with the prednisone she improved from 16-20 BMs a day to 5-6.  Last night she had BMs approximately every half hour. No new changes in diets, meds etc.  She has had 5-6 stools today.  She is due to decrease to 5 mg of prednisone tomorrow.  Please advise

## 2013-06-25 NOTE — Telephone Encounter (Signed)
Left message for patient to call back She is asked on voicemail to increase to 40 mg and call me in the am

## 2013-06-26 ENCOUNTER — Encounter: Payer: 59 | Admitting: Internal Medicine

## 2013-06-26 MED ORDER — PREDNISONE 10 MG PO TABS: ORAL_TABLET | ORAL | Status: AC

## 2013-06-26 NOTE — Telephone Encounter (Signed)
Patient aware REV scheduled for 07/26/13 3:00

## 2013-06-28 ENCOUNTER — Encounter: Payer: Self-pay | Admitting: Internal Medicine

## 2013-07-03 ENCOUNTER — Telehealth: Payer: Self-pay | Admitting: Internal Medicine

## 2013-07-03 NOTE — Telephone Encounter (Signed)
Left message for patient to call back  

## 2013-07-03 NOTE — Telephone Encounter (Signed)
Patient reports continued loose stools 4-5 a day.  She is still on 40 mg of prednisone daily.  Please advise the next step

## 2013-07-04 ENCOUNTER — Telehealth: Payer: Self-pay

## 2013-07-04 MED ORDER — PANTOPRAZOLE SODIUM 40 MG PO TBEC: 40.0000 mg | DELAYED_RELEASE_TABLET | Freq: Every day | ORAL | Status: AC

## 2013-07-04 MED ORDER — DIPHENOXYLATE-ATROPINE 2.5-0.025 MG PO TABS: 1.0000 | ORAL_TABLET | Freq: Four times a day (QID) | ORAL | Status: DC | PRN

## 2013-07-04 MED ORDER — ESOMEPRAZOLE MAGNESIUM 20 MG PO CPDR: 40.0000 mg | DELAYED_RELEASE_CAPSULE | Freq: Every day | ORAL | Status: AC

## 2013-07-04 NOTE — Telephone Encounter (Signed)
Patient aware REV scheduled for

## 2013-07-04 NOTE — Telephone Encounter (Signed)
Rx Lomotil 1 po every 6 hrs as needed #90 no refill REV by next week please

## 2013-07-04 NOTE — Telephone Encounter (Signed)
Spoke to Donna Hamilton to make sure they had received the Lomotil rx via fax.  They had.  While on the phone she said that Protonix is free from [facility] and that the Nexium  Comes in the 40mg  and would cost $25.00 a month.  Therefore I gave her rx to have on file for the Protonix so when patient comes in she will have choices.

## 2013-07-05 ENCOUNTER — Telehealth: Payer: Self-pay | Admitting: Internal Medicine

## 2013-07-05 NOTE — Telephone Encounter (Signed)
Follow-up My Chart note

## 2013-07-10 ENCOUNTER — Ambulatory Visit (INDEPENDENT_AMBULATORY_CARE_PROVIDER_SITE_OTHER): Payer: 59 | Admitting: Internal Medicine

## 2013-07-10 ENCOUNTER — Encounter: Payer: Self-pay | Admitting: Internal Medicine

## 2013-07-10 VITALS — BP 142/82 | HR 72 | Ht 64.0 in | Wt 142.8 lb

## 2013-07-10 DIAGNOSIS — K5289 Other specified noninfective gastroenteritis and colitis: Secondary | ICD-10-CM

## 2013-07-10 DIAGNOSIS — K52832 Lymphocytic colitis: Secondary | ICD-10-CM

## 2013-07-10 DIAGNOSIS — R197 Diarrhea, unspecified: Secondary | ICD-10-CM

## 2013-07-10 NOTE — Progress Notes (Signed)
         Subjective:    Patient ID: Donna Hamilton, female    DOB: 09-08-1950, 63 y.o.   MRN: [id number]  HPI the patient is here for followup diarrhea. She had a sudden onset of a diarrheal illness in October, and has had persistent diarrhea since. See previous notes but basic stool studies were unrevealing for infection and fecal leukocytes. I performed a colonoscopy which was unrevealing endoscopic views but did show findings consistent with lymphocytic colitis. Prednisone was started but did not help. The patient is much better on Lomotil - 2-3 stools a day She feels bloated but is not having significant cramps or any rectal bleeding. She traveled to [place] in December and had a severe time with dehydration with "out of comission" for 2-3 days No Known Allergies Outpatient Prescriptions Prior to Visit  Medication Sig Dispense Refill  . ALPRAZolam (XANAX) 0.25 MG tablet Take 0.25 mg by mouth. Takes 1/2 of tablet prn for sleep      . atorvastatin (LIPITOR) 20 MG tablet Take 20 mg by mouth daily.      . Calcium Carbonate-Vitamin D (CALTRATE 600+D PO) Take by mouth. Takes 1 tablets daily      . Cholecalciferol (VITAMIN D3) 2000 UNITS TABS Take by mouth. Take 2 daily      . citalopram (CELEXA) 20 MG tablet Take 20 mg by mouth daily. Takes 3 tablets daily      . diphenoxylate-atropine (LOMOTIL) 2.5-0.025 MG per tablet Take 1 tablet by mouth every 6 (six) hours as needed for diarrhea or loose stools.  90 tablet  0  . esomeprazole (NEXIUM) 20 MG capsule Take 2 capsules (40 mg total) by mouth daily.  90 capsule  3  . fenofibrate 160 MG tablet Take 160 mg by mouth daily.      . Glucosamine HCl-Glucosamin SO4 (GLUCOSAMINE COMPLEX PO) Take by mouth. Takes 2 tablets daily      . niacinamide 500 MG tablet Take 500 mg by mouth. Takes 2 tablets daily      . omega-3 acid ethyl esters (LOVAZA) 1 G capsule Take 1 g by mouth 2 (two) times daily.      . pantoprazole (PROTONIX) 40 MG tablet Take 1 tablet (40  mg total) by mouth daily.  90 tablet  3  . predniSONE (DELTASONE) 10 MG tablet Take as directed  100 tablet  0  . Probiotic Product (RESTORA PO) Take 1 capsule by mouth daily.      . SBI/Protein Isolate (ENTERAGAM) 5 G PACK Take 1 packet by mouth 2 (two) times daily.       No facility-administered medications prior to visit.   Past Medical History  Diagnosis Date  . Arthritis   . Anxiety   . Depression   . Breast cancer 2002    left breast  . Hx of adenomatous colonic polyps 2005    [place]  . Hyperlipidemia   . Lymphocytic colitis 05/02/2013   Past Surgical History  Procedure Laterality Date  . Mastectomy  2002    left  . Tonsillectomy    . Reconstruction breast w/ tram flap  2002  . Polypectomy    . Colonoscopy     Review of Systems As above    Objective:   Physical Exam WDWN NAD    Assessment & Plan:   1. Diarrhea   2. Lymphocytic colitis ?   CC: Jerlyn Ly, MD

## 2013-07-10 NOTE — Assessment & Plan Note (Addendum)
GI pathogen panel. To look for this infection. Note that she did not respond to metronidazole prescribed earlier. Celiac testing was negative. ? lotronex - info given Taper prednisone Maybe she has a severe postinfectious IBS.

## 2013-07-10 NOTE — Patient Instructions (Addendum)
Take your prednisone as follow:  30mg  for 3 days, 20mg  for 5 days, 10mg  for 5 days , 5 mg for 5 days then stop.  We are giving you information to read on Lotronex.  Your physician has requested that you go to the basement for the following lab work before leaving today: Stool studies   I appreciate the opportunity to care for you.

## 2013-07-10 NOTE — Assessment & Plan Note (Addendum)
Seems like an incorrect diagnosis since she did notrespond to prednisone, we'll taper off prednisone continue Lomotil. Question Lotronex. I have introduced the concept of Lotronex. We'll do a GI pathogen panel as well.

## 2013-07-11 ENCOUNTER — Other Ambulatory Visit: Payer: 59

## 2013-07-11 DIAGNOSIS — R197 Diarrhea, unspecified: Secondary | ICD-10-CM

## 2013-07-12 LAB — GASTROINTESTINAL PATHOGEN PANEL PCR
C. DIFFICILE TOX A/B, PCR: NEGATIVE
Campylobacter, PCR: NEGATIVE
Cryptosporidium, PCR: NEGATIVE
E COLI (STEC) STX1/STX2, PCR: NEGATIVE
E COLI 0157, PCR: NEGATIVE
E coli (ETEC) LT/ST PCR: NEGATIVE
Giardia lamblia, PCR: NEGATIVE
Norovirus, PCR: NEGATIVE
Rotavirus A, PCR: NEGATIVE
SALMONELLA, PCR: NEGATIVE
Shigella, PCR: NEGATIVE

## 2013-07-16 ENCOUNTER — Other Ambulatory Visit: Payer: Self-pay

## 2013-07-16 MED ORDER — COLESTIPOL HCL 5 G PO PACK: PACK | ORAL | Status: AC

## 2013-07-16 NOTE — Progress Notes (Signed)
Quick Note:  Another option with low chance of side effects is colestipol 5 g 1 packet with supper each night May Rx if she desires  #30 1 refill  ______

## 2013-07-16 NOTE — Progress Notes (Signed)
Quick Note:  No sign of infection here I recommend she try Lotronex 0.5 mg bid # 60 no refill She has an info sheet and could sign it and return it Should stop Lomotil when she starts it  Other option is to wait until scheduled f/u to discuss this and continue Lomotil for now ______

## 2013-07-17 ENCOUNTER — Other Ambulatory Visit: Payer: Self-pay | Admitting: Internal Medicine

## 2013-07-17 MED ORDER — DIPHENOXYLATE-ATROPINE 2.5-0.025 MG PO TABS: 1.0000 | ORAL_TABLET | Freq: Four times a day (QID) | ORAL | Status: AC | PRN

## 2013-07-25 ENCOUNTER — Encounter: Payer: Self-pay | Admitting: Internal Medicine

## 2013-07-25 ENCOUNTER — Other Ambulatory Visit: Payer: Self-pay

## 2013-07-25 DIAGNOSIS — R197 Diarrhea, unspecified: Secondary | ICD-10-CM

## 2013-07-25 NOTE — Telephone Encounter (Signed)
Dr. Carlean Purl see notes from the patient.  She would like a CT.  She has a follow up with you on 08/14/13

## 2013-07-26 ENCOUNTER — Ambulatory Visit: Payer: 59 | Admitting: Internal Medicine

## 2013-07-26 ENCOUNTER — Encounter: Payer: Self-pay | Admitting: Internal Medicine

## 2013-07-26 ENCOUNTER — Other Ambulatory Visit (INDEPENDENT_AMBULATORY_CARE_PROVIDER_SITE_OTHER): Payer: 59

## 2013-07-26 DIAGNOSIS — R197 Diarrhea, unspecified: Secondary | ICD-10-CM

## 2013-07-26 LAB — COMPREHENSIVE METABOLIC PANEL
ALBUMIN: 4 g/dL (ref 3.5–5.2)
ALT: 26 U/L (ref 0–35)
AST: 23 U/L (ref 0–37)
Alkaline Phosphatase: 58 U/L (ref 39–117)
BUN: 21 mg/dL (ref 6–23)
CALCIUM: 9.2 mg/dL (ref 8.4–10.5)
CHLORIDE: 103 meq/L (ref 96–112)
CO2: 29 meq/L (ref 19–32)
Creatinine, Ser: 0.7 mg/dL (ref 0.4–1.2)
GFR: 95.15 mL/min (ref >60.00)
Glucose, Bld: 75 mg/dL (ref 70–99)
Potassium: 3.7 mEq/L (ref 3.5–5.1)
SODIUM: 139 meq/L (ref 135–145)
TOTAL PROTEIN: 6.3 g/dL (ref 6.0–8.3)
Total Bilirubin: 0.8 mg/dL (ref 0.3–1.2)

## 2013-07-26 LAB — CBC WITH DIFFERENTIAL/PLATELET
BASOS ABS: 0 10*3/uL (ref 0.0–0.1)
Basophils Relative: 0.3 % (ref 0.0–3.0)
Eosinophils Absolute: 0.1 10*3/uL (ref 0.0–0.7)
Eosinophils Relative: 0.4 % (ref 0.0–5.0)
HEMATOCRIT: 39.3 % (ref 36.0–46.0)
HEMOGLOBIN: 13.2 g/dL (ref 12.0–15.0)
LYMPHS ABS: 4.7 10*3/uL — AB (ref 0.7–4.0)
Lymphocytes Relative: 40.1 % (ref 12.0–46.0)
MCHC: 33.5 g/dL (ref 30.0–36.0)
MCV: 96.4 fl (ref 78.0–100.0)
MONOS PCT: 6.3 % (ref 3.0–12.0)
Monocytes Absolute: 0.7 10*3/uL (ref 0.1–1.0)
Neutro Abs: 6.2 10*3/uL (ref 1.4–7.7)
Neutrophils Relative %: 52.9 % (ref 43.0–77.0)
PLATELETS: 274 10*3/uL (ref 150.0–400.0)
RBC: 4.08 Mil/uL (ref 3.87–5.11)
RDW: 14.1 % (ref 11.5–14.6)
WBC: 11.7 10*3/uL — AB (ref 4.5–10.5)

## 2013-07-26 LAB — C-REACTIVE PROTEIN: CRP: <0.5 mg/dL (ref 0.5–20.0)

## 2013-07-26 LAB — TSH: TSH: 2.36 u[IU]/mL (ref 0.35–5.50)

## 2013-07-26 LAB — SEDIMENTATION RATE: SED RATE: 2 mm/h (ref 0–22)

## 2013-07-26 NOTE — Progress Notes (Signed)
Patient ID: Donna Hamilton, female   DOB: [birth year], 63 y.o.   MRN: [id number] Patient weight today was 145 lbs.

## 2013-07-29 ENCOUNTER — Other Ambulatory Visit: Payer: Self-pay

## 2013-07-29 DIAGNOSIS — Z853 Personal history of malignant neoplasm of breast: Secondary | ICD-10-CM

## 2013-07-29 DIAGNOSIS — R197 Diarrhea, unspecified: Secondary | ICD-10-CM

## 2013-07-29 NOTE — Progress Notes (Signed)
Quick Note:  I called lab results to her. She remains very concerned about possibility of recurrent breast cancer. Please order abd/pelvic CT with contrast re: chronic diarrhea, hx breast cancer She needs it done in evening so she does not have to miss work so try [employer] ______

## 2013-07-30 ENCOUNTER — Ambulatory Visit (INDEPENDENT_AMBULATORY_CARE_PROVIDER_SITE_OTHER): Payer: Self-pay | Admitting: *Deleted

## 2013-07-30 DIAGNOSIS — I781 Nevus, non-neoplastic: Secondary | ICD-10-CM

## 2013-07-30 NOTE — Progress Notes (Signed)
X=.3% Sotradecol administered with a 27g butterfly.  Patient received a total of 9cc. Treated all vessels that remained since last treatment. Tol well. Easy access. CL may be a good option to clean up remaining tiny red vessels in a few months.  Photos: yes  Compression stockings applied: yes

## 2013-08-01 ENCOUNTER — Other Ambulatory Visit: Payer: 59

## 2013-08-07 ENCOUNTER — Other Ambulatory Visit: Payer: Self-pay | Admitting: Dermatology

## 2013-08-08 ENCOUNTER — Ambulatory Visit (HOSPITAL_COMMUNITY)
Admission: RE | Admit: 2013-08-08 | Discharge: 2013-08-08 | Disposition: A | Discharge status: Home / Self Care | Payer: 59 | Source: Ambulatory Visit | Attending: Internal Medicine | Admitting: Internal Medicine

## 2013-08-08 DIAGNOSIS — Z853 Personal history of malignant neoplasm of breast: Secondary | ICD-10-CM | POA: Insufficient documentation

## 2013-08-08 DIAGNOSIS — K7689 Other specified diseases of liver: Secondary | ICD-10-CM | POA: Insufficient documentation

## 2013-08-08 DIAGNOSIS — R197 Diarrhea, unspecified: Secondary | ICD-10-CM | POA: Insufficient documentation

## 2013-08-08 MED ORDER — IOHEXOL 300 MG/ML  SOLN
80.0000 mL | Freq: Once | INTRAMUSCULAR | Status: AC | PRN
  Administered 2013-08-08: 80 mL via INTRAVENOUS

## 2013-08-08 NOTE — Progress Notes (Signed)
Quick Note:  Multiple small liver lesions most of which look like cysts An MRI will tell us better Needs MR liver without and with IV Gadolinium ______

## 2013-08-09 ENCOUNTER — Other Ambulatory Visit: Payer: Self-pay

## 2013-08-09 DIAGNOSIS — K769 Liver disease, unspecified: Secondary | ICD-10-CM

## 2013-08-14 ENCOUNTER — Ambulatory Visit: Payer: 59 | Admitting: Internal Medicine

## 2013-08-16 ENCOUNTER — Ambulatory Visit (HOSPITAL_COMMUNITY)
Admission: RE | Admit: 2013-08-16 | Discharge: 2013-08-16 | Disposition: A | Discharge status: Home / Self Care | Payer: 59 | Source: Ambulatory Visit | Attending: Internal Medicine | Admitting: Internal Medicine

## 2013-08-16 DIAGNOSIS — K7689 Other specified diseases of liver: Secondary | ICD-10-CM | POA: Insufficient documentation

## 2013-08-16 DIAGNOSIS — Z853 Personal history of malignant neoplasm of breast: Secondary | ICD-10-CM | POA: Insufficient documentation

## 2013-08-16 DIAGNOSIS — K769 Liver disease, unspecified: Secondary | ICD-10-CM

## 2013-08-16 MED ORDER — GADOXETATE DISODIUM 0.25 MMOL/ML IV SOLN
5.0000 mL | Freq: Once | INTRAVENOUS | Status: AC | PRN
  Administered 2013-08-16: 5 mL via INTRAVENOUS

## 2013-08-19 ENCOUNTER — Encounter: Payer: Self-pay | Admitting: Internal Medicine

## 2013-08-19 NOTE — Progress Notes (Signed)
Quick Note:  MRI shows that liver lesions were all cysts - not causing her problems She will need REV ______

## 2013-08-19 NOTE — Progress Notes (Signed)
Quick Note:  OK-noted ______

## 2013-08-26 ENCOUNTER — Ambulatory Visit (HOSPITAL_COMMUNITY): Admission: RE | Admit: 2013-08-26 | Payer: 59 | Source: Ambulatory Visit

## 2014-02-13 ENCOUNTER — Other Ambulatory Visit: Payer: Self-pay

## 2014-02-13 DIAGNOSIS — Z853 Personal history of malignant neoplasm of breast: Secondary | ICD-10-CM

## 2014-02-13 DIAGNOSIS — Z1231 Encounter for screening mammogram for malignant neoplasm of breast: Secondary | ICD-10-CM

## 2014-02-13 DIAGNOSIS — Z9012 Acquired absence of left breast and nipple: Secondary | ICD-10-CM

## 2014-02-25 ENCOUNTER — Ambulatory Visit: Payer: 59

## 2014-02-28 ENCOUNTER — Other Ambulatory Visit: Payer: Self-pay | Admitting: Obstetrics & Gynecology

## 2014-02-28 DIAGNOSIS — N6459 Other signs and symptoms in breast: Secondary | ICD-10-CM

## 2014-03-04 ENCOUNTER — Ambulatory Visit: Payer: 59

## 2014-03-11 ENCOUNTER — Ambulatory Visit
Admission: RE | Admit: 2014-03-11 | Discharge: 2014-03-11 | Disposition: A | Discharge status: Home / Self Care | Payer: 59 | Source: Ambulatory Visit | Attending: Obstetrics & Gynecology | Admitting: Obstetrics & Gynecology

## 2014-03-11 DIAGNOSIS — N6459 Other signs and symptoms in breast: Secondary | ICD-10-CM

## 2014-08-11 ENCOUNTER — Other Ambulatory Visit: Payer: Self-pay | Admitting: Family Medicine

## 2014-12-08 ENCOUNTER — Other Ambulatory Visit: Payer: Self-pay | Admitting: Psychiatry

## 2014-12-18 ENCOUNTER — Ambulatory Visit (INDEPENDENT_AMBULATORY_CARE_PROVIDER_SITE_OTHER): Payer: 59 | Admitting: Psychiatry

## 2014-12-18 ENCOUNTER — Encounter: Payer: Self-pay | Admitting: Psychiatry

## 2014-12-18 VITALS — BP 118/82 | HR 70 | Temp 97.7°F | Ht 63.0 in | Wt 132.4 lb

## 2014-12-18 DIAGNOSIS — F332 Major depressive disorder, recurrent severe without psychotic features: Secondary | ICD-10-CM

## 2014-12-18 DIAGNOSIS — H269 Unspecified cataract: Secondary | ICD-10-CM | POA: Insufficient documentation

## 2014-12-18 DIAGNOSIS — F331 Major depressive disorder, recurrent, moderate: Secondary | ICD-10-CM | POA: Insufficient documentation

## 2014-12-18 DIAGNOSIS — Z720 Tobacco use: Secondary | ICD-10-CM | POA: Insufficient documentation

## 2014-12-18 MED ORDER — ALPRAZOLAM 0.25 MG PO TABS: 0.2500 mg | ORAL_TABLET | Freq: Every day | ORAL | Status: DC

## 2014-12-18 NOTE — Progress Notes (Signed)
BH MD/PA/NP OP Progress Note  12/18/2014 7:04 PM Donna Hamilton  MRN:  [id number]  Subjective:  "I'm not feeling so good" and describes being more depressed for the past month. Specific stress is that she had a argument with a long-term friend of hers. This resulted in alienation of the friendship. It has still not resolved. Her mood is down and anxious much of the time. Sleep is a little bit impaired. Not doing very much else that she enjoys. No suicidal ideation no psychotic symptoms. Physically otherwise things are about the same. She has tolerated her current medicine including 90 mg of Cymbalta without difficulty Chief Complaint:  depressed Visit Diagnosis:  Recurrent major depression severe without psychotic features   ICD-9-CM ICD-10-CM   1. Severe recurrent major depression without psychotic features 296.33 F33.2     Past Medical History:  Past Medical History  Diagnosis Date  . Arthritis   . Anxiety   . Depression   . Breast cancer 2002    left breast  . Hx of adenomatous colonic polyps 2005    [place]  . Hyperlipidemia   . Lymphocytic colitis 05/02/2013    Past Surgical History  Procedure Laterality Date  . Mastectomy  2002    left  . Tonsillectomy    . Reconstruction breast w/ tram flap  2002  . Polypectomy    . Colonoscopy     Family History:  Family History  Problem Relation Age of Onset  . Colon cancer Neg Hx   . Stomach cancer Neg Hx   . Other Father     Varicose Veins  . Heart disease Mother   . Hypertension Mother   . Other Mother     Varicose Veins  . Hypertension Brother    Social History:  History   Social History  . Marital Status: Divorced    Spouse Name: N/A  . Number of Children: 2  . 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 Smoking status: Current Every Day Smoker -- 0.50 packs/day for 30 years    Types: Cigarettes  . Smokeless tobacco: Never Used   Comment: Tobacco info given 05/02/13-5 cigs a day  . Alcohol Use: 4.2 oz/week    7 Glasses of wine per week  . Drug Use: No  . Sexual Activity: Not on file   Other Topics Concern  . None   Social History Narrative   Additional History: Patient lives by herself. Has close contact with her daughter. Feels anxious that her closest friend in the area is not speaking to her currently. Memory she is doing well  Assessment: Chronic and recurrent major depression. Current symptoms may be driven by current situation but also are indicative of some degree of depression  Musculoskeletal: Strength & Muscle Tone: within normal limits Gait & Station: normal Patient leans: N/A  Psychiatric Specialty Exam: HPI  ROS  Blood pressure 118/82, pulse 70, temperature 97.7 F (36.5 C), temperature source Tympanic, height 5\' 3"  (1.6 m), weight 132 lb 6.4 oz (60.056 kg), SpO2 95 %.Body mass index is 23.46 kg/(m^2).  General Appearance: Casual  Eye Contact:  Good  Speech:  Normal Rate  Volume:  Normal  Mood:  Depressed  Affect:  Constricted and Depressed  Thought Process:  Goal Directed  Orientation:  Full (Time, Place, and Person)  Thought Content:  Negative  Suicidal Thoughts:  No  Homicidal  Thoughts:  No  Memory:  Immediate;   Good Recent;   Good Remote;   Good  Judgement:  Good  Insight:  Good  Psychomotor Activity:  Normal  Concentration:  Fair  Recall:  Good  Fund of Knowledge: Good  Language: Good  Akathisia:  No  Handed:  Right  AIMS (if indicated):     Assets:  Communication Skills Desire for Improvement Financial Resources/Insurance Housing Resilience Social Support  ADL's:  Intact  Cognition: WNL  Sleep:  good   Is the patient at risk to self?  No. Has the patient been a risk to self in the past 6 months?  No. Has the patient been a risk to self within the distant past?  No. Is the patient a risk to others?  No. Has the patient been a risk to others in the past 6  months?  No. Has the patient been a risk to others within the distant past?  No.  Current Medications: Current Outpatient Prescriptions  Medication Sig Dispense Refill  . ALPRAZolam (XANAX) 0.25 MG tablet Take 1 tablet (0.25 mg total) by mouth daily at 6 (six) AM. Takes 1/2 of tablet prn for sleep 30 tablet 3  . atorvastatin (LIPITOR) 20 MG tablet Take 20 mg by mouth daily.    . Calcium Carbonate-Vitamin D (CALTRATE 600+D PO) Take by mouth. Takes 1 tablets daily    . Cholecalciferol (VITAMIN D3) 2000 UNITS TABS Take by mouth. Take 2 daily    . colestipol (COLESTID) 5 G packet Take one packet with supper each night 30 each 1  . DULoxetine (CYMBALTA) 30 MG capsule TAKE 1 CAPSULE BY MOUTH ONCE DAILY FOR ONE WEEK, THEN INCREASE TO 2 CAPSULES DAILY THEREAFTER 60 capsule PRN  . esomeprazole (NEXIUM) 20 MG capsule Take 2 capsules (40 mg total) by mouth daily. 90 capsule 3  . fenofibrate 160 MG tablet Take 160 mg by mouth daily.    . niacinamide 500 MG tablet Take 500 mg by mouth. Takes 2 tablets daily    . omega-3 acid ethyl esters (LOVAZA) 1 G capsule Take 1 g by mouth 2 (two) times daily.    . pantoprazole (PROTONIX) 40 MG tablet Take 1 tablet (40 mg total) by mouth daily. 90 tablet 3  . Probiotic Product (RESTORA PO) Take 1 capsule by mouth daily.    . SBI/Protein Isolate (ENTERAGAM) 5 G PACK Take 1 packet by mouth 2 (two) times daily.    . citalopram (CELEXA) 20 MG tablet Take 20 mg by mouth daily. Takes 3 tablets daily    . diphenoxylate-atropine (LOMOTIL) 2.5-0.025 MG per tablet Take 1 tablet by mouth every 6 (six) hours as needed for diarrhea or loose stools. (Patient not taking: Reported on 12/18/2014) 90 tablet 0  . Glucosamine HCl-Glucosamin SO4 (GLUCOSAMINE COMPLEX PO) Take by mouth. Takes 2 tablets daily    . predniSONE (DELTASONE) 10 MG tablet Take as directed (Patient not taking: Reported on 12/18/2014) 100 tablet 0  . traZODone (DESYREL) 50 MG tablet Take by mouth.     No current  facility-administered medications for this visit.    Medical Decision Making:  Review of Psycho-Social Stressors (1), Established Problem, Worsening (2), Review of Medication Regimen & Side Effects (2) and Review of New Medication or Change in Dosage (2)  Treatment Plan Summary:Medication management and Plan Supportive counseling. Encourage patient with her efforts to stay active and work on improving the relationship. Reviewed symptoms of depression. We agreed to increase the Cymbalta to  120 mg per day. Continue other medicine. Follow-up in a month.   John Clapacs 12/18/2014, 7:04 PM

## 2015-01-27 ENCOUNTER — Other Ambulatory Visit: Payer: Self-pay

## 2015-01-27 ENCOUNTER — Encounter: Payer: Self-pay | Admitting: Emergency Medicine

## 2015-01-27 ENCOUNTER — Emergency Department
Admission: EM | Admit: 2015-01-27 | Discharge: 2015-01-27 | Disposition: A | Discharge status: Home / Self Care | Payer: 59 | Attending: Emergency Medicine | Admitting: Emergency Medicine

## 2015-01-27 DIAGNOSIS — Z79899 Other long term (current) drug therapy: Secondary | ICD-10-CM | POA: Diagnosis not present

## 2015-01-27 DIAGNOSIS — I159 Secondary hypertension, unspecified: Secondary | ICD-10-CM | POA: Insufficient documentation

## 2015-01-27 DIAGNOSIS — Z720 Tobacco use: Secondary | ICD-10-CM | POA: Diagnosis not present

## 2015-01-27 DIAGNOSIS — R531 Weakness: Secondary | ICD-10-CM | POA: Diagnosis not present

## 2015-01-27 DIAGNOSIS — R197 Diarrhea, unspecified: Secondary | ICD-10-CM | POA: Insufficient documentation

## 2015-01-27 LAB — BASIC METABOLIC PANEL
Anion gap: 9 (ref 5–15)
BUN: 8 mg/dL (ref 6–20)
CALCIUM: 9.1 mg/dL (ref 8.9–10.3)
CO2: 28 mmol/L (ref 22–32)
CREATININE: 0.53 mg/dL (ref 0.44–1.00)
Chloride: 105 mmol/L (ref 101–111)
Glucose, Bld: 99 mg/dL (ref 65–99)
Potassium: 3.4 mmol/L — ABNORMAL LOW (ref 3.5–5.1)
SODIUM: 142 mmol/L (ref 135–145)

## 2015-01-27 LAB — CBC WITH DIFFERENTIAL/PLATELET
Basophils Absolute: 0 10*3/uL (ref 0–0.1)
Basophils Relative: 1 %
EOS PCT: 1 %
Eosinophils Absolute: 0.1 10*3/uL (ref 0–0.7)
HEMATOCRIT: 41.4 % (ref 35.0–47.0)
HEMOGLOBIN: 14.2 g/dL (ref 12.0–16.0)
LYMPHS ABS: 2.1 10*3/uL (ref 1.0–3.6)
Lymphocytes Relative: 24 %
MCH: 32.8 pg (ref 26.0–34.0)
MCHC: 34.2 g/dL (ref 32.0–36.0)
MCV: 95.9 fL (ref 80.0–100.0)
MONOS PCT: 7 %
Monocytes Absolute: 0.6 10*3/uL (ref 0.2–0.9)
NEUTROS ABS: 5.8 10*3/uL (ref 1.4–6.5)
NEUTROS PCT: 67 %
Platelets: 194 10*3/uL (ref 150–440)
RBC: 4.32 MIL/uL (ref 3.80–5.20)
RDW: 13.6 % (ref 11.5–14.5)
WBC: 8.6 10*3/uL (ref 3.6–11.0)

## 2015-01-27 LAB — MAGNESIUM: Magnesium: 1.9 mg/dL (ref 1.7–2.4)

## 2015-01-27 MED ORDER — SODIUM CHLORIDE 0.9 % IV BOLUS (SEPSIS)
500.0000 mL | INTRAVENOUS | Status: AC
  Administered 2015-01-27: 500 mL via INTRAVENOUS

## 2015-01-27 NOTE — Discharge Instructions (Signed)
As we discussed, your workup today was reassuring.  The we do not know exactly what is causing her symptoms, it appears that you have no emergent medical condition at this time are safe to go home and follow up as recommended in this paperwork.  I do think it is important that you discuss her blood pressure with your primary care doctor.  It remains elevated during her stay in the emergency department, but this may be secondary to simply being in the emergency department and not feeling as well as your baseline.  Please continue to eat and drink your normal diet and take any normal medications that you have at home.  Please return immediately to the Emergency Department if you develop any new or worsening symptoms that concern you.

## 2015-01-27 NOTE — ED Provider Notes (Addendum)
[facility] Emergency Department Provider Note  ____________________________________________  Time seen: Approximately 12:41 PM  I have reviewed the triage vital signs and the nursing notes.   HISTORY  Chief Complaint Allergic Reaction    HPI Donna Hamilton is a 64 y.o. female with a history of tobacco use and hyperlipidemia as well as chronic colitis but no other significant past medical history who presents with generalized "shakiness "and lightheadedness in the setting of a colonoscopy performed at [facility] yesterday.  She wonders if this may be the result of her anesthesia.  She had an uncomplicated procedure (I was able to review the procedure note through care everywhere) and no complications postoperatively.  She states that she has been eating and drinking well since the procedure.  She denies nausea and vomiting and has had no abdominal pain.  She has had normal bowel movements for her (chronic diarrhea) with no evidence of bleeding.  She denies chest pain and shortness of breath as well as fever and chills.  She states that she "just does not feel" like she "got some bad drugs ".  She describes her symptoms as moderate.  Nothing makes it better and nothing makes it worse.   Past Medical History  Diagnosis Date  . Arthritis   . Anxiety   . Depression   . Breast cancer 2002    left breast  . Hx of adenomatous colonic polyps 2005    [place]  . Hyperlipidemia   . Lymphocytic colitis 05/02/2013    Patient Active Problem List   Diagnosis Date Noted  . Depression, major, recurrent, moderate 12/18/2014  . Bilateral cataracts 12/18/2014  . Current tobacco use 12/18/2014  . Severe recurrent major depression without psychotic features 12/18/2014  . Diarrhea 07/10/2013  . Lymphocytic colitis? 05/02/2013  . Varicose veins of lower extremities with other complications 66/11/3014  . Breast cancer 10/28/2011  . Personal history of colonic polyps - adenomas  07/18/2003    Past Surgical History  Procedure Laterality Date  . Mastectomy  2002    left  . Tonsillectomy    . Reconstruction breast w/ tram flap  2002  . Polypectomy    . Colonoscopy      Current Outpatient Rx  Name  Route  Sig  Dispense  Refill  . ALPRAZolam (XANAX) 0.25 MG tablet   Oral   Take 1 tablet (0.25 mg total) by mouth daily at 6 (six) AM. Takes 1/2 of tablet prn for sleep   30 tablet   3   . atorvastatin (LIPITOR) 20 MG tablet   Oral   Take 20 mg by mouth daily.         . Calcium Carbonate-Vitamin D (CALTRATE 600+D PO)   Oral   Take by mouth. Takes 1 tablets daily         . Cholecalciferol (VITAMIN D3) 2000 UNITS TABS   Oral   Take by mouth. Take 2 daily         . citalopram (CELEXA) 20 MG tablet   Oral   Take 20 mg by mouth daily. Takes 3 tablets daily         . colestipol (COLESTID) 5 G packet      Take one packet with supper each night   30 each   1   . diphenoxylate-atropine (LOMOTIL) 2.5-0.025 MG per tablet   Oral   Take 1 tablet by mouth every 6 (six) hours as needed for diarrhea or loose stools. Patient not  taking: Reported on 12/18/2014   90 tablet   0   . DULoxetine (CYMBALTA) 30 MG capsule      TAKE 1 CAPSULE BY MOUTH ONCE DAILY FOR ONE WEEK, THEN INCREASE TO 2 CAPSULES DAILY THEREAFTER   60 capsule   PRN   . esomeprazole (NEXIUM) 20 MG capsule   Oral   Take 2 capsules (40 mg total) by mouth daily.   90 capsule   3   . fenofibrate 160 MG tablet   Oral   Take 160 mg by mouth daily.         . Glucosamine HCl-Glucosamin SO4 (GLUCOSAMINE COMPLEX PO)   Oral   Take by mouth. Takes 2 tablets daily         . niacinamide 500 MG tablet   Oral   Take 500 mg by mouth. Takes 2 tablets daily         . omega-3 acid ethyl esters (LOVAZA) 1 G capsule   Oral   Take 1 g by mouth 2 (two) times daily.         . pantoprazole (PROTONIX) 40 MG tablet   Oral   Take 1 tablet (40 mg total) by mouth daily.   90 tablet   3      Given to pharmacy to have on file, she will take t ...   . predniSONE (DELTASONE) 10 MG tablet      Take as directed Patient not taking: Reported on 12/18/2014   100 tablet   0   . Probiotic Product (RESTORA PO)   Oral   Take 1 capsule by mouth daily.         . SBI/Protein Isolate (ENTERAGAM) 5 G PACK   Oral   Take 1 packet by mouth 2 (two) times daily.         . traZODone (DESYREL) 50 MG tablet   Oral   Take by mouth.           Allergies Review of patient's allergies indicates no known allergies.  Family History  Problem Relation Age of Onset  . Colon cancer Neg Hx   . Stomach cancer Neg Hx   . Other Father     Varicose Veins  . Heart disease Mother   . Hypertension Mother   . Other Mother     Varicose Veins  . Hypertension Brother     Social History History  Substance Use Topics  . Smoking status: Current Every Day Smoker -- 0.50 packs/day for 30 years    Types: Cigarettes  . Smokeless tobacco: Never Used     Comment: Tobacco info given 05/02/13-5 cigs a day  . Alcohol Use: 4.2 oz/week    7 Glasses of wine per week    Review of Systems Constitutional: No fever/chills.  General malaise/weakness/shakiness Eyes: No visual changes. ENT: No sore throat. Cardiovascular: Denies chest pain. Respiratory: Denies shortness of breath. Gastrointestinal: No abdominal pain.  No nausea, no vomiting.  Chronic diarrhea.  No constipation. Genitourinary: Negative for dysuria. Musculoskeletal: Negative for back pain. Skin: Negative for rash. Neurological: Negative for headaches, focal weakness or numbness.  10-point ROS otherwise negative.  ____________________________________________   PHYSICAL EXAM:  VITAL SIGNS: ED Triage Vitals  Enc Vitals Group     BP 01/27/15 1126 181/75 mmHg     Pulse Rate 01/27/15 1126 70     Resp 01/27/15 1126 20     Temp 01/27/15 1126 98.6 F (37 C)     Temp Source 01/27/15  1126 Oral     SpO2 01/27/15 1126 100 %      Weight 01/27/15 1126 130 lb (58.968 kg)     Height 01/27/15 1126 5\' 4"  (1.626 m)     Head Cir --      Peak Flow --      Pain Score --      Pain Loc --      Pain Edu? --      Excl. in [place]? --     Constitutional: Alert and oriented. Well appearing and in no acute distress.  Laughing and joking with me. Eyes: Conjunctivae are normal. PERRL. EOMI. Head: Atraumatic. Nose: No congestion/rhinnorhea. Mouth/Throat: Mucous membranes are moist.  Oropharynx non-erythematous. Neck: No stridor.   Cardiovascular: Normal rate, regular rhythm. Grossly normal heart sounds.  Good peripheral circulation. Respiratory: Normal respiratory effort.  No retractions. Lungs CTAB. Gastrointestinal: Soft and nontender.  No rebound nor guarding.  No distention. No abdominal bruits. No CVA tenderness.  Musculoskeletal: No lower extremity tenderness nor edema.  No joint effusions. Neurologic:  Normal speech and language. No gross focal neurologic deficits are appreciated.  No cerebellar deficits.  Normal gait.  Negative Romberg.  No pronator drift. Skin:  Skin is warm, dry and intact. No rash noted. Psychiatric: Mood and affect are normal. Speech and behavior are normal.  ____________________________________________   LABS (all labs ordered are listed, but only abnormal results are displayed)  Labs Reviewed  BASIC METABOLIC PANEL - Abnormal; Notable for the following:    Potassium 3.4 (*)    All other components within normal limits  MAGNESIUM  CBC WITH DIFFERENTIAL/PLATELET   ____________________________________________  EKG  ED ECG REPORT I, Eulia Hatcher, the attending physician, personally viewed and interpreted this ECG.  Date: 01/27/2015 EKG Time: 13:03 Rate: 63 Rhythm: normal sinus rhythm QRS Axis: normal Intervals: normal ST/T Wave abnormalities: normal Conduction Disutrbances: none Narrative Interpretation: unremarkable  ____________________________________________  RADIOLOGY  No  results found.  ____________________________________________   PROCEDURES  Procedure(s) performed: None  Critical Care performed: No ____________________________________________   INITIAL IMPRESSION / ASSESSMENT AND PLAN / ED COURSE  Pertinent labs & imaging results that were available during my care of the patient were reviewed by me and considered in my medical decision making (see chart for details).  The patient is well-appearing and has a normal physical exam.  Her vital signs are normal except for hypertension.  She has no tachycardia and is afebrile.  She has no abdominal pain nor tenderness to suggest a perforation and she is taking good by mouth intake and has had bowel movements and is passing gas.  There is no indication for any imaging at this time.  I discussed with the patient extensively her symptoms and my plan.  At this time we will place an IV, obtain basic labs including a CBC with differential (I anticipate a slight leukocytosis given her recent procedure and I discussed this with the patient as well), basic metabolic panel including magnesium, and we will provide a small fluid bolus and then reassess the patient.  We will also obtain an EKG as a general screening exam and her symptoms of weakness and shakiness but I will not obtain a troponin given that she has no signs or symptoms to suggest ACS.  ----------------------------------------- 2:14 PM on 01/27/2015 -----------------------------------------  The patient still feels "foggy" but "maybe" a little bit better after the small bolus of IV fluids.  I stood by her side as she ambulated and, as documented above  in the physical exam, she has no neurological deficits including no concerns for a cerebellar deficit.  I discussed with her the possibility of a CT head noncontrast, but we both agree that the utility of such a study is incredibly low at this point.  She remains hypertensive, and she said that she has been  hypertensive in the past, but is not consistently hypertensive.  In theory, this could be a biological response to a CVA, but again, she has no clinical exam findings to suggest a CVA.  I discussed this possibility with her including explaining my reasoning about how in theory she could have had an episode of hypotension during her procedure which led to a mild stroke, but there was no indication of such an event in her procedure or anesthesia note, and she agrees with me that she has no signs or symptoms that are concerning.  I do not believe that a CT scan is indicated at this time.   We discussed the plan, and she is going to call her primary care doctor today to schedule the next available follow-up appointment.  I gave her my usual and customary return precautions and she understands and agrees. ____________________________________________  FINAL CLINICAL IMPRESSION(S) / ED DIAGNOSES  Final diagnoses:  Weakness generalized      NEW MEDICATIONS STARTED DURING THIS VISIT:  New Prescriptions   No medications on file     Hinda Kehr, MD 01/27/15 1431

## 2015-01-27 NOTE — ED Notes (Signed)
Pt presents with possible allergic reaction to propofol that was given yesterday while having colonoscopy at [facility]. Pt awoke this am with shakiness, lightheadedness, feels like head is in a fog, chills. Denies n/v.

## 2015-01-27 NOTE — ED Notes (Signed)
Patient had colonoscopy yesterday and began with dizzy and lightheadedness today.  No swelling, SHOB, dysphagia, or rash.  Has not eaten today, has drank.

## 2015-02-12 ENCOUNTER — Ambulatory Visit (INDEPENDENT_AMBULATORY_CARE_PROVIDER_SITE_OTHER): Payer: 59 | Admitting: Psychiatry

## 2015-02-12 DIAGNOSIS — F332 Major depressive disorder, recurrent severe without psychotic features: Secondary | ICD-10-CM | POA: Diagnosis not present

## 2015-02-12 MED ORDER — TRAZODONE HCL 50 MG PO TABS: 50.0000 mg | ORAL_TABLET | Freq: Every evening | ORAL | Status: DC | PRN

## 2015-02-12 MED ORDER — ALPRAZOLAM 0.25 MG PO TABS: 0.2500 mg | ORAL_TABLET | Freq: Every day | ORAL | Status: DC | PRN

## 2015-02-12 MED ORDER — DULOXETINE HCL 60 MG PO CPEP: 120.0000 mg | ORAL_CAPSULE | Freq: Every day | ORAL | Status: DC

## 2015-03-30 NOTE — Progress Notes (Signed)
[facility] MD Progress Note  03/30/2015 5:04 PM Donna Hamilton  MRN:  [id number] Subjective:  Follow-up for this 64 year old woman with a history of depression who had recently complained of a worsening of depression. Today she says she is feeling better. Not feeling nearly as sad. Sleeping better. No medicine side effects. Denies suicidal ideation. Feels more upbeat and positive. Principal Problem: @PPROB @ Diagnosis:   Patient Active Problem List   Diagnosis Date Noted  . Depression, major, recurrent, moderate ([place]) [F33.1] 12/18/2014  . Bilateral cataracts [H26.9] 12/18/2014  . Current tobacco use [Z72.0] 12/18/2014  . Severe recurrent major depression without psychotic features ([place]) [F33.2] 12/18/2014  . Diarrhea [R19.7] 07/10/2013  . Lymphocytic colitis? [K52.89] 05/02/2013  . Varicose veins of lower extremities with other complications [T06.269] 48/54/6270  . Breast cancer ([place]) [C50.919] 10/28/2011  . Personal history of colonic polyps - adenomas [Z86.010] 07/18/2003   Total Time spent with patient: 20 minutes  Past Psychiatric History: past history of recurrent episodes of depression with good response to medication. No history of psychosis  Past Medical History:  Past Medical History  Diagnosis Date  . Arthritis   . Anxiety   . Depression   . Breast cancer 2002    left breast  . Hx of adenomatous colonic polyps 2005    [place]  . Hyperlipidemia   . Lymphocytic colitis 05/02/2013    Past Surgical History  Procedure Laterality Date  . Mastectomy  2002    left  . Tonsillectomy    . Reconstruction breast w/ tram flap  2002  . Polypectomy    . Colonoscopy     Family History:  Family History  Problem Relation Age of Onset  . Colon cancer Neg Hx   . Stomach cancer Neg Hx   . Other Father     Varicose Veins  . Heart disease Mother   . Hypertension Mother   . Other Mother     Varicose Veins  . Hypertension Brother    Family Psychiatric  History: family history  positive for anxiety and depression Social History:  History  Alcohol Use  . 4.2 oz/week  . 7 Glasses of wine per week     History  Drug Use No    Social History   Social History  . Marital Status: Divorced    Spouse Name: N/A  . Number of Children: 2  . 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 Smoking status: Current Every Day Smoker -- 0.50 packs/day for 30 years    Types: Cigarettes  . Smokeless tobacco: Never Used     Comment: Tobacco info given 05/02/13-5 cigs a day  . Alcohol Use: 4.2 oz/week    7 Glasses of wine per week  . Drug Use: No  . Sexual Activity: Not on file   Other Topics Concern  . Not on file   Social History Narrative   Additional Social History:                         Sleep: Good  Appetite:  Fair  Current Medications: Current Outpatient Prescriptions  Medication Sig Dispense Refill  . DULoxetine (CYMBALTA) 60 MG capsule Take 2 capsules (120 mg total) by mouth daily. 60 capsule 5  . traZODone (DESYREL) 50 MG tablet Take 1-2 tablets (50-100 mg total) by mouth at bedtime as needed for  sleep. 30 tablet 5  . ALPRAZolam (XANAX) 0.25 MG tablet Take 1 tablet (0.25 mg total) by mouth daily as needed. Takes 1/2 of tablet prn for sleep 30 tablet 3  . atorvastatin (LIPITOR) 20 MG tablet Take 20 mg by mouth daily.    . budesonide (ENTOCORT EC) 3 MG 24 hr capsule Take 9 mg by mouth daily.    . Calcium Carbonate-Vitamin D (CALTRATE 600+D PO) Take by mouth. Takes 1 tablets daily    . Cholecalciferol (VITAMIN D3) 2000 UNITS TABS Take by mouth. Take 2 daily    . citalopram (CELEXA) 20 MG tablet Take 20 mg by mouth daily. Takes 3 tablets daily    . colestipol (COLESTID) 5 G packet Take one packet with supper each night 30 each 1  . cyclobenzaprine (FLEXERIL) 10 MG tablet Take 10 mg by mouth 3 (three) times daily as needed for muscle spasms.    . diphenoxylate-atropine  (LOMOTIL) 2.5-0.025 MG per tablet Take 1 tablet by mouth every 6 (six) hours as needed for diarrhea or loose stools. (Patient not taking: Reported on 12/18/2014) 90 tablet 0  . esomeprazole (NEXIUM) 20 MG capsule Take 2 capsules (40 mg total) by mouth daily. 90 capsule 3  . fenofibrate 160 MG tablet Take 160 mg by mouth daily.    . fluticasone (FLONASE) 50 MCG/ACT nasal spray Place 2 sprays into both nostrils daily.    . Glucosamine HCl-Glucosamin SO4 (GLUCOSAMINE COMPLEX PO) Take by mouth. Takes 2 tablets daily    . niacinamide 500 MG tablet Take 500 mg by mouth. Takes 2 tablets daily    . omega-3 acid ethyl esters (LOVAZA) 1 G capsule Take 4 g by mouth daily.     . pantoprazole (PROTONIX) 40 MG tablet Take 1 tablet (40 mg total) by mouth daily. 90 tablet 3  . predniSONE (DELTASONE) 10 MG tablet Take as directed (Patient not taking: Reported on 12/18/2014) 100 tablet 0  . Probiotic Product (RESTORA PO) Take 1 capsule by mouth daily.    . SBI/Protein Isolate (ENTERAGAM) 5 G PACK Take 1 packet by mouth 2 (two) times daily.     No current facility-administered medications for this visit.    Lab Results: No results found for this or any previous visit (from the past 48 hour(s)).  Physical Findings: AIMS:  , ,  ,  ,    CIWA:    COWS:     Musculoskeletal: Strength & Muscle Tone: within normal limits Gait & Station: normal Patient leans: N/A  Psychiatric Specialty Exam: ROS  There were no vitals taken for this visit.There is no weight on file to calculate BMI.  General Appearance: Fairly Groomed  Engineer, water::  Fair  Speech:  Normal Rate  Volume:  Normal  Mood:  Euthymic  Affect:  Congruent  Thought Process:  Logical  Orientation:  Full (Time, Place, and Person)  Thought Content:  Negative  Suicidal Thoughts:  No  Homicidal Thoughts:  No  Memory:  Immediate;   Good Recent;   Good Remote;   Good  Judgement:  Fair  Insight:  Fair  Psychomotor Activity:  Normal  Concentration:   Fair  Recall:  [place] of Knowledge:Fair  Language: Good  Akathisia:  No  Handed:  Right  AIMS (if indicated):     Assets:  Communication Skills Desire for Improvement Financial Resources/Insurance Housing Physical Health Resilience  ADL's:  Intact  Cognition: WNL  Sleep:      Treatment Plan Summary: Medication management  and Plan patient is currently taking Cymbalta 60 mg a day. Still uses the Xanax at only a quarter milligram a day and trazodone 50 at night. No change treatment plan. Reviewed signs of depression and medicine side effects. Continue current medication. Follow-up 4 months.  Donna Hamilton 03/30/2015, 5:04 PM

## 2015-04-07 ENCOUNTER — Ambulatory Visit
Admission: RE | Admit: 2015-04-07 | Discharge: 2015-04-07 | Disposition: A | Discharge status: Home / Self Care | Payer: 59 | Source: Ambulatory Visit

## 2015-04-07 DIAGNOSIS — Z853 Personal history of malignant neoplasm of breast: Secondary | ICD-10-CM

## 2015-04-07 DIAGNOSIS — Z1231 Encounter for screening mammogram for malignant neoplasm of breast: Secondary | ICD-10-CM

## 2015-04-07 DIAGNOSIS — Z9012 Acquired absence of left breast and nipple: Secondary | ICD-10-CM

## 2015-06-04 ENCOUNTER — Ambulatory Visit (INDEPENDENT_AMBULATORY_CARE_PROVIDER_SITE_OTHER): Payer: 59 | Admitting: Psychiatry

## 2015-06-04 ENCOUNTER — Encounter: Payer: Self-pay | Admitting: Psychiatry

## 2015-06-04 VITALS — BP 136/84 | HR 82 | Temp 96.5°F | Resp 16 | Ht 64.0 in | Wt 124.2 lb

## 2015-06-04 DIAGNOSIS — F332 Major depressive disorder, recurrent severe without psychotic features: Secondary | ICD-10-CM

## 2015-06-04 MED ORDER — TRAZODONE HCL 50 MG PO TABS: 50.0000 mg | ORAL_TABLET | Freq: Every evening | ORAL | Status: DC | PRN

## 2015-06-04 MED ORDER — DULOXETINE HCL 60 MG PO CPEP: 120.0000 mg | ORAL_CAPSULE | Freq: Every day | ORAL | Status: AC

## 2015-06-04 MED ORDER — ALPRAZOLAM 0.25 MG PO TABS: 0.2500 mg | ORAL_TABLET | Freq: Every day | ORAL | Status: AC | PRN

## 2015-06-04 NOTE — Progress Notes (Signed)
[facility] MD Progress Note  06/04/2015 3:50 PM Donna Hamilton  MRN:  [id number] Subjective:  Follow-up for patient with recurrent severe depression no new complaints. Mood has remained good. No return of severe depression. No suicidal ideation. No side effects of medicine. No functional problems. Medically stable. Principal Problem: @PPROB @ Diagnosis:   Patient Active Problem List   Diagnosis Date Noted  . Depression, major, recurrent, moderate ([place]) [F33.1] 12/18/2014  . Bilateral cataracts [H26.9] 12/18/2014  . Current tobacco use [Z72.0] 12/18/2014  . Severe recurrent major depression without psychotic features ([place]) [F33.2] 12/18/2014  . Diarrhea [R19.7] 07/10/2013  . Lymphocytic colitis? [id number] 05/02/2013  . Varicose veins of lower extremities with other complications [id number]  . Breast cancer ([place]) [C50.919] 10/28/2011  . Personal history of colonic polyps - adenomas [Z86.010] 07/18/2003   Total Time spent with patient: 25 minutes  Past Psychiatric History: Past history of recurrent episodes of depression  Past Medical History:  Past Medical History  Diagnosis Date  . Arthritis   . Anxiety   . Depression   . Breast cancer ([place]) 2002    left breast  . Hx of adenomatous colonic polyps 2005    [place]  . Hyperlipidemia   . Lymphocytic colitis 05/02/2013    Past Surgical History  Procedure Laterality Date  . Mastectomy  2002    left  . Tonsillectomy    . Reconstruction breast w/ tram flap  2002  . Polypectomy    . Colonoscopy     Family History:  Family History  Problem Relation Age of Onset  . Colon cancer Neg Hx   . Stomach cancer Neg Hx   . Other Father     Varicose Veins  . Heart disease Mother   . Hypertension Mother   . Other Mother     Varicose Veins  . Hypertension Brother    Family Psychiatric  History: Family history negative Social History:  History  Alcohol Use  . 4.2 oz/week  . 7 Glasses of wine per week     History  Drug Use  No    Social History   Social History  . Marital Status: Divorced    Spouse Name: N/A  . Number of Children: 2  . 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 Smoking status: Current Every Day Smoker -- 0.50 packs/day for 30 years    Types: Cigarettes  . Smokeless tobacco: Never Used     Comment: Tobacco info given 05/02/13-5 cigs a day  . Alcohol Use: 4.2 oz/week    7 Glasses of wine per week  . Drug Use: No  . Sexual Activity: Not Asked   Other Topics Concern  . None   Social History Narrative   Additional Social History:                         Sleep: Fair  Appetite:  Fair  Current Medications: Current Outpatient Prescriptions  Medication Sig Dispense Refill  . ALPRAZolam (XANAX) 0.25 MG tablet Take 1 tablet (0.25 mg total) by mouth daily as needed. Takes 1/2 of tablet prn for sleep 30 tablet 5  . atorvastatin (LIPITOR) 20 MG tablet Take 20 mg by mouth daily.    . Cholecalciferol (VITAMIN D3) 2000 UNITS TABS Take by mouth. Reported on 06/04/2015    . DULoxetine (CYMBALTA) 60 MG capsule Take  2 capsules (120 mg total) by mouth daily. 60 capsule 5  . fenofibrate 160 MG tablet Take 160 mg by mouth daily.    . fluticasone (FLONASE) 50 MCG/ACT nasal spray Place 2 sprays into both nostrils daily.    Marland Kitchen omega-3 acid ethyl esters (LOVAZA) 1 G capsule Take 4 g by mouth daily.     . predniSONE (DELTASONE) 10 MG tablet Take as directed 100 tablet 0  . Probiotic Product (RESTORA PO) Take 1 capsule by mouth daily.    . traZODone (DESYREL) 50 MG tablet Take 1-2 tablets (50-100 mg total) by mouth at bedtime as needed for sleep. 30 tablet 5  . budesonide (ENTOCORT EC) 3 MG 24 hr capsule Take 9 mg by mouth daily. Reported on 06/04/2015    . Calcium Carbonate-Vitamin D (CALTRATE 600+D PO) Take by mouth. Reported on 06/04/2015    . citalopram (CELEXA) 20 MG tablet Take 20 mg by mouth daily. Reported on  06/04/2015    . colestipol (COLESTID) 5 G packet Take one packet with supper each night (Patient not taking: Reported on 06/04/2015) 30 each 1  . cyclobenzaprine (FLEXERIL) 10 MG tablet Take 10 mg by mouth 3 (three) times daily as needed for muscle spasms. Reported on 06/04/2015    . diphenoxylate-atropine (LOMOTIL) 2.5-0.025 MG per tablet Take 1 tablet by mouth every 6 (six) hours as needed for diarrhea or loose stools. (Patient not taking: Reported on 12/18/2014) 90 tablet 0  . esomeprazole (NEXIUM) 20 MG capsule Take 2 capsules (40 mg total) by mouth daily. (Patient not taking: Reported on 06/04/2015) 90 capsule 3  . Glucosamine HCl-Glucosamin SO4 (GLUCOSAMINE COMPLEX PO) Take by mouth. Reported on 06/04/2015    . niacinamide 500 MG tablet Take 500 mg by mouth. Reported on 06/04/2015    . pantoprazole (PROTONIX) 40 MG tablet Take 1 tablet (40 mg total) by mouth daily. (Patient not taking: Reported on 06/04/2015) 90 tablet 3  . SBI/Protein Isolate (ENTERAGAM) 5 G PACK Take 1 packet by mouth 2 (two) times daily. Reported on 06/04/2015     No current facility-administered medications for this visit.    Lab Results: No results found for this or any previous visit (from the past 48 hour(s)).  Physical Findings: AIMS:  , ,  ,  ,    CIWA:    COWS:     Musculoskeletal: Strength & Muscle Tone: within normal limits Gait & Station: normal Patient leans: N/A  Psychiatric Specialty Exam: ROS  Blood pressure 136/84, pulse 82, temperature 96.5 F (35.8 C), temperature source Tympanic, resp. rate 16, height 5\' 4"  (1.626 m), weight 124 lb 3.2 oz (56.337 kg), SpO2 96 %.Body mass index is 21.31 kg/(m^2).  General Appearance: Fairly Groomed  Engineer, water::  Good  Speech:  Clear and Coherent  Volume:  Normal  Mood:  Euthymic  Affect:  Appropriate and Constricted  Thought Process:  Goal Directed  Orientation:  Full (Time, Place, and Person)  Thought Content:  Negative  Suicidal Thoughts:  No   Homicidal Thoughts:  No  Memory:  Immediate;   Good Recent;   Good Remote;   Good  Judgement:  Fair  Insight:  Fair  Psychomotor Activity:  Normal  Concentration:  Fair  Recall:  Good  Fund of Knowledge:Good  Language: Fair  Akathisia:  No  Handed:  Right  AIMS (if indicated):     Assets:  Communication Skills Desire for Improvement Financial Resources/Insurance Physical Health  ADL's:  Intact  Cognition: WNL  Sleep:      Treatment Plan Summary: Medication management and Plan patient is doing well. Tolerating medicine well. No side effects. Sleeping well. No change to medication plan. Everything refilled with 6 months total. Follow-up 6 months. Call sooner if needed.  Hyun Reali 06/04/2015, 3:50 PM

## 2015-06-11 ENCOUNTER — Ambulatory Visit: Payer: Self-pay | Admitting: Psychiatry

## 2015-06-25 DIAGNOSIS — L578 Other skin changes due to chronic exposure to nonionizing radiation: Secondary | ICD-10-CM | POA: Diagnosis not present

## 2015-06-25 DIAGNOSIS — L570 Actinic keratosis: Secondary | ICD-10-CM | POA: Diagnosis not present

## 2015-06-25 MED FILL — FLUTICASONE PROP 0.05% CRM: 0.05 | 30 days supply | Qty: 30 | Fill #0

## 2015-06-29 MED FILL — DULoxetine HCL 60 MG CPEP: 60 | 30 days supply | Qty: 60 | Fill #4

## 2015-06-29 MED FILL — OMEGA-3 ETHYL ESTERS 1 GM C: 1 | 30 days supply | Qty: 120 | Fill #1

## 2015-06-29 MED FILL — FLUTICASONE PROP 50 MCG SPR: 50 | 30 days supply | Qty: 16 | Fill #2

## 2015-06-29 MED FILL — ACYCLOVIR 5% OINTMENT: 5 | 20 days supply | Qty: 15 | Fill #1

## 2015-07-06 MED FILL — PANTOPRAZOLE SOD DR 40 MG T: 40 | 90 days supply | Qty: 90 | Fill #1

## 2015-07-28 MED FILL — DULoxetine HCL 60 MG CPEP: 60 | 30 days supply | Qty: 60 | Fill #5

## 2015-07-28 MED FILL — ATORVASTATIN 20 MG TABLET: 20 | 90 days supply | Qty: 90 | Fill #1

## 2015-07-28 MED FILL — ALPRAZolam 0.25 MG TABS: 0.25 | 20 days supply | Qty: 30 | Fill #0

## 2015-08-06 DIAGNOSIS — L309 Dermatitis, unspecified: Secondary | ICD-10-CM | POA: Diagnosis not present

## 2015-08-26 DIAGNOSIS — M19042 Primary osteoarthritis, left hand: Secondary | ICD-10-CM | POA: Diagnosis not present

## 2015-08-26 DIAGNOSIS — R52 Pain, unspecified: Secondary | ICD-10-CM | POA: Diagnosis not present

## 2015-08-26 DIAGNOSIS — M129 Arthropathy, unspecified: Secondary | ICD-10-CM | POA: Diagnosis not present

## 2015-08-26 MED FILL — DULoxetine HCL 60 MG CPEP: 60 | 30 days supply | Qty: 60 | Fill #0

## 2015-08-26 MED FILL — FENOFIBRATE 160 MG TABLET: 160 | 90 days supply | Qty: 90 | Fill #1

## 2015-08-26 MED FILL — ALPRAZolam 0.25 MG TABS: 0.25 | 20 days supply | Qty: 30 | Fill #1

## 2015-08-27 MED FILL — MELOXICAM 15 MG TABLET: 15 | 30 days supply | Qty: 30 | Fill #0

## 2015-09-09 ENCOUNTER — Emergency Department (HOSPITAL_COMMUNITY)
Admission: EM | Admit: 2015-09-09 | Discharge: 2015-09-09 | Disposition: A | Discharge status: Home / Self Care | Payer: 59 | Attending: Emergency Medicine | Admitting: Emergency Medicine

## 2015-09-09 ENCOUNTER — Encounter (HOSPITAL_COMMUNITY): Payer: Self-pay | Admitting: Emergency Medicine

## 2015-09-09 ENCOUNTER — Emergency Department (HOSPITAL_COMMUNITY): Payer: 59

## 2015-09-09 DIAGNOSIS — F419 Anxiety disorder, unspecified: Secondary | ICD-10-CM | POA: Insufficient documentation

## 2015-09-09 DIAGNOSIS — Z8739 Personal history of other diseases of the musculoskeletal system and connective tissue: Secondary | ICD-10-CM | POA: Diagnosis not present

## 2015-09-09 DIAGNOSIS — S199XXA Unspecified injury of neck, initial encounter: Secondary | ICD-10-CM | POA: Insufficient documentation

## 2015-09-09 DIAGNOSIS — Y9389 Activity, other specified: Secondary | ICD-10-CM | POA: Diagnosis not present

## 2015-09-09 DIAGNOSIS — Z8719 Personal history of other diseases of the digestive system: Secondary | ICD-10-CM | POA: Diagnosis not present

## 2015-09-09 DIAGNOSIS — F17210 Nicotine dependence, cigarettes, uncomplicated: Secondary | ICD-10-CM | POA: Diagnosis not present

## 2015-09-09 DIAGNOSIS — L0100 Impetigo, unspecified: Secondary | ICD-10-CM | POA: Diagnosis not present

## 2015-09-09 DIAGNOSIS — R51 Headache: Secondary | ICD-10-CM | POA: Diagnosis not present

## 2015-09-09 DIAGNOSIS — E785 Hyperlipidemia, unspecified: Secondary | ICD-10-CM | POA: Diagnosis not present

## 2015-09-09 DIAGNOSIS — Z7952 Long term (current) use of systemic steroids: Secondary | ICD-10-CM | POA: Diagnosis not present

## 2015-09-09 DIAGNOSIS — Z86010 Personal history of colonic polyps: Secondary | ICD-10-CM | POA: Diagnosis not present

## 2015-09-09 DIAGNOSIS — M542 Cervicalgia: Secondary | ICD-10-CM | POA: Diagnosis not present

## 2015-09-09 DIAGNOSIS — F329 Major depressive disorder, single episode, unspecified: Secondary | ICD-10-CM | POA: Insufficient documentation

## 2015-09-09 DIAGNOSIS — Z853 Personal history of malignant neoplasm of breast: Secondary | ICD-10-CM | POA: Diagnosis not present

## 2015-09-09 DIAGNOSIS — Z79899 Other long term (current) drug therapy: Secondary | ICD-10-CM | POA: Insufficient documentation

## 2015-09-09 DIAGNOSIS — S0081XA Abrasion of other part of head, initial encounter: Secondary | ICD-10-CM | POA: Insufficient documentation

## 2015-09-09 DIAGNOSIS — Y9289 Other specified places as the place of occurrence of the external cause: Secondary | ICD-10-CM | POA: Insufficient documentation

## 2015-09-09 DIAGNOSIS — R519 Headache, unspecified: Secondary | ICD-10-CM

## 2015-09-09 DIAGNOSIS — S0990XA Unspecified injury of head, initial encounter: Secondary | ICD-10-CM | POA: Diagnosis present

## 2015-09-09 DIAGNOSIS — Y998 Other external cause status: Secondary | ICD-10-CM | POA: Diagnosis not present

## 2015-09-09 LAB — I-STAT CHEM 8, ED
BUN: 14 mg/dL (ref 6–20)
Calcium, Ion: 1.21 mmol/L (ref 1.13–1.30)
Chloride: 100 mmol/L — ABNORMAL LOW (ref 101–111)
Creatinine, Ser: 0.6 mg/dL (ref 0.44–1.00)
Glucose, Bld: 83 mg/dL (ref 65–99)
HEMATOCRIT: 40 % (ref 36.0–46.0)
HEMOGLOBIN: 13.6 g/dL (ref 12.0–15.0)
Potassium: 4 mmol/L (ref 3.5–5.1)
SODIUM: 139 mmol/L (ref 135–145)
TCO2: 28 mmol/L (ref 0–100)

## 2015-09-09 MED ORDER — ACETAMINOPHEN 500 MG PO TABS
1000.0000 mg | ORAL_TABLET | Freq: Once | ORAL | Status: AC
  Administered 2015-09-09: 1000 mg via ORAL
  Filled 2015-09-09: qty 2

## 2015-09-09 MED ORDER — IOHEXOL 350 MG/ML SOLN
100.0000 mL | Freq: Once | INTRAVENOUS | Status: AC | PRN
  Administered 2015-09-09: 100 mL via INTRAVENOUS

## 2015-09-09 MED ORDER — IBUPROFEN 800 MG PO TABS
800.0000 mg | ORAL_TABLET | Freq: Once | ORAL | Status: AC
  Administered 2015-09-09: 800 mg via ORAL
  Filled 2015-09-09: qty 1

## 2015-09-09 MED ORDER — CEPHALEXIN 500 MG PO CAPS: 500.0000 mg | ORAL_CAPSULE | Freq: Four times a day (QID) | ORAL | Status: AC
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
## 2015-09-10 MED FILL — FLUTICASONE PROP 50 MCG SPR: 50 | 30 days supply | Qty: 16 | Fill #3

## 2015-09-10 MED FILL — OMEGA-3 ETHYL ESTERS 1 GM C: 1 | 30 days supply | Qty: 120 | Fill #2

## 2015-09-10 MED FILL — CEPHALEXIN 500 MG CAPSULE: 500 | 5 days supply | Qty: 20 | Fill #0

## 2015-09-14 ENCOUNTER — Other Ambulatory Visit: Payer: Self-pay | Admitting: Psychiatry

## 2015-09-14 DIAGNOSIS — E781 Pure hyperglyceridemia: Secondary | ICD-10-CM | POA: Diagnosis not present

## 2015-09-14 DIAGNOSIS — Z6820 Body mass index (BMI) 20.0-20.9, adult: Secondary | ICD-10-CM | POA: Diagnosis not present

## 2015-09-14 MED FILL — ALPRAZolam 0.25 MG TABS: 0.25 | 20 days supply | Qty: 30 | Fill #2

## 2015-09-15 DIAGNOSIS — E781 Pure hyperglyceridemia: Secondary | ICD-10-CM | POA: Diagnosis not present

## 2015-09-16 MED FILL — traZODone HCL 50 MG TABS: 50 | 30 days supply | Qty: 30 | Fill #0

## 2015-09-16 MED FILL — DULoxetine HCL 60 MG CPEP: 60 | 30 days supply | Qty: 60 | Fill #1

## 2015-12-03 ENCOUNTER — Ambulatory Visit: Payer: Self-pay | Admitting: Psychiatry

## 2017-11-03 IMAGING — CT CT ANGIO HEAD
1 of 10 series · 5 of 33 positions shown · IV contrast (OMNIPAQUE 300)
Comparison: None.

CLINICAL DATA: Initial valuation for fall. Headache with right neck
pain.

EXAM:
CT ANGIOGRAPHY HEAD AND NECK
TECHNIQUE: Multidetector CT imaging of the head and neck was performed using
the standard protocol during bolus administration of intravenous
contrast. Multiplanar CT image reconstructions and MIPs were
obtained to evaluate the vascular anatomy. Carotid stenosis
measurements (when applicable) are obtained utilizing NASCET
criteria, using the distal internal carotid diameter as the
denominator.
CONTRAST:  100mL OMNIPAQUE IOHEXOL 350 MG/ML SOLN

[Series 9: axial thin · axial · 0.39mm/px · z∈[-270,-64]mm · 5 of 310 slices shown]
[im 52/310  soft-tissue]
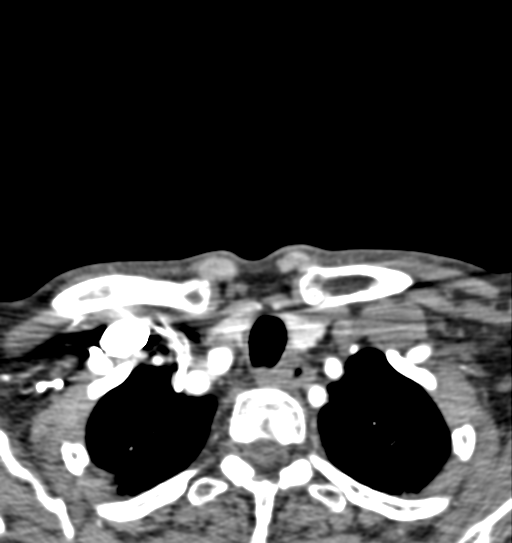
[im 104/310  bone]
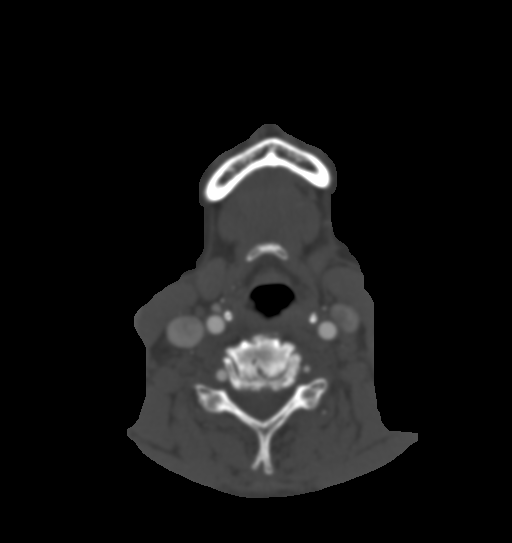
[im 155/310  soft-tissue]
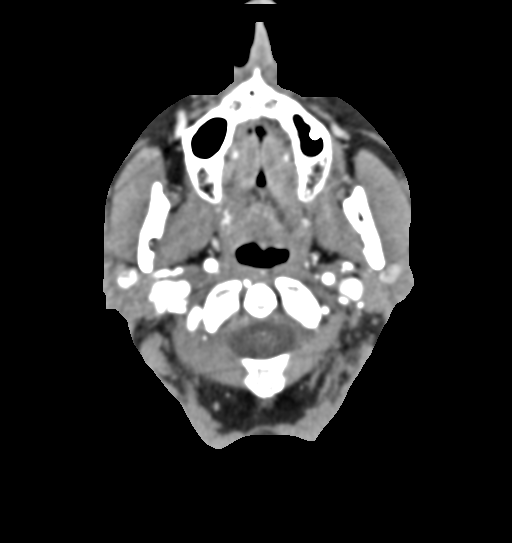
[im 207/310  bone]
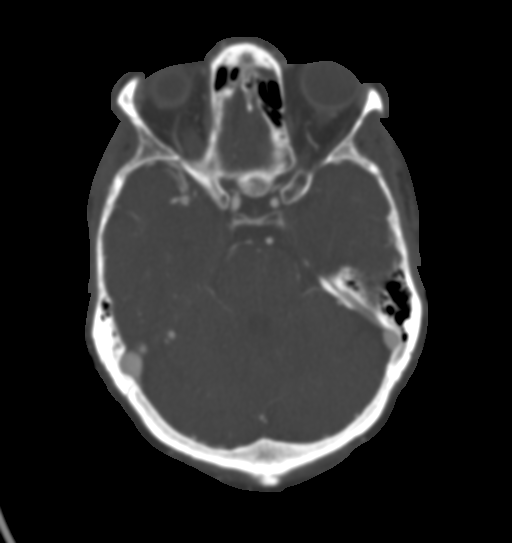
[im 258/310  soft-tissue]
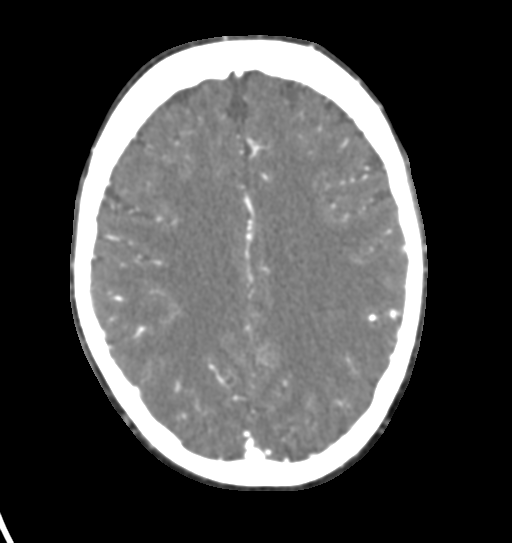

[5 of 33 positions shown; findings below may reference images not displayed]

FINDINGS: CT HEAD

There is no acute intracranial hemorrhage or infarct. No mass lesion
or midline shift. Gray-white matter differentiation is well
maintained. Ventricles are normal in size without evidence of
hydrocephalus. CSF containing spaces are within normal limits. No
extra-axial fluid collection.

The calvarium is intact.

Orbital soft tissues are within normal limits.

The paranasal sinuses and mastoid air cells are well pneumatized and
free of fluid.

Scalp soft tissues are unremarkable.

CTA NECK

Aortic arch: Aortic arch of normal caliber with normal 3 vessel
morphology. No high-grade stenosis seen at the origin of the great
vessels. No acute abnormality within the visualize arch. Visualized
subclavian arteries widely patent.

Right carotid system: Right common carotid artery patent from its
origin to the bifurcation. No significant atheromatous disease about
the right bifurcation. Right ICA in demonstrates minimal
atheromatous irregularity proximally without significant stenosis.
Right ICA well opacified distally to the skullbase without
dissection or other acute abnormality.

Left carotid system: Left common carotid artery patent from its
origin to the bifurcation. Minimal plaque about the left carotid
bifurcation without significant stenosis. Left ICA widely patent
from the bifurcation to the skullbase. No dissection or other acute
abnormality within the left carotid artery system.

Vertebral arteries:Both vertebral arteries arise from the subclavian
arteries. Right vertebral artery dominant. Vertebral arteries widely
patent without evidence for stenosis, dissection or other acute
abnormality.

Skeleton: Reversal of the normal cervical lordosis with moderate
degenerative spondylolysis at C4-5 through C6-7. Multilevel facet
arthrosis noted. No worrisome lytic or blastic osseous lesions.

Other neck: Visualized lungs are clear. Thyroid normal. No
adenopathy. No acute soft tissue abnormality.

CTA HEAD

Anterior circulation: The petrous, cavernous and supraclinoid
segments are widely patent. A1 segments, anterior communicating
artery, and anterior cerebral arteries well opacified. M1 segments
widely patent without stenosis or occlusion. MCA bifurcations
normal. Venous contamination at the right MCA bifurcation. Distal
MCA branches well opacified and symmetric.

Posterior circulation: Vertebral arteries widely patent to the
vertebrobasilar junction. Right vertebral artery dominant. Posterior
inferior cerebral arteries patent bilaterally. Basilar artery widely
patent. Superior cerebral arteries well opacified. Fetal type right
PCA noted. PCAs widely patent without stenosis or occlusion.

Venous sinuses: No evidence for venous sinus thrombosis.

Anatomic variants: Fetal type right PCA.  No aneurysm.

Delayed phase: No pathologic enhancement.
IMPRESSION: Normal CTA of the head and neck.  No acute intracranial process.

## 2023-05-10 ENCOUNTER — Encounter: Payer: Self-pay | Admitting: Internal Medicine
